# Patient Record
Sex: Female | Born: 1965 | Race: White | Hispanic: No | Marital: Married | Smoking: Current every day smoker
Health system: Southern US, Community
[De-identification: ages and names within clinical notes are randomized; demographics above are authoritative.]

## PROBLEM LIST (undated history)

## (undated) DIAGNOSIS — E119 Type 2 diabetes mellitus without complications: Secondary | ICD-10-CM

## (undated) DIAGNOSIS — F32A Depression, unspecified: Secondary | ICD-10-CM

## (undated) DIAGNOSIS — F329 Major depressive disorder, single episode, unspecified: Secondary | ICD-10-CM

## (undated) HISTORY — PX: KNEE SURGERY: SHX244

## (undated) HISTORY — PX: COLONOSCOPY WITH PROPOFOL: SHX5780

## (undated) HISTORY — PX: ABDOMINAL HYSTERECTOMY: SHX81

---

## 2006-06-28 ENCOUNTER — Ambulatory Visit: Payer: Self-pay | Admitting: Internal Medicine

## 2006-06-28 ENCOUNTER — Emergency Department: Payer: Self-pay | Admitting: Emergency Medicine

## 2006-11-27 ENCOUNTER — Ambulatory Visit: Payer: Self-pay | Admitting: Physician Assistant

## 2010-09-26 ENCOUNTER — Ambulatory Visit: Payer: Self-pay | Admitting: Family Medicine

## 2011-04-05 ENCOUNTER — Ambulatory Visit: Payer: Self-pay | Admitting: Neurology

## 2012-08-30 ENCOUNTER — Ambulatory Visit: Payer: Self-pay | Admitting: Family Medicine

## 2013-06-21 ENCOUNTER — Ambulatory Visit: Payer: Self-pay | Admitting: Family Medicine

## 2014-04-28 ENCOUNTER — Ambulatory Visit
Admit: 2014-04-28 | Disposition: A | Discharge status: Home / Self Care | Payer: Self-pay | Attending: Family Medicine | Admitting: Family Medicine

## 2014-04-28 LAB — URINALYSIS, COMPLETE
Bacteria: NEGATIVE
Bilirubin,UR: NEGATIVE
Blood: NEGATIVE
Glucose,UR: NEGATIVE
Ketone: NEGATIVE
Leukocyte Esterase: NEGATIVE
Nitrite: NEGATIVE
PROTEIN: NEGATIVE
Ph: 5.5 (ref 5.0–8.0)
RBC, UR: NONE SEEN /HPF (ref 0–5)
Specific Gravity: 1.015 (ref 1.000–1.030)

## 2014-04-28 LAB — COMPREHENSIVE METABOLIC PANEL
ALBUMIN: 3.9 g/dL
ALK PHOS: 103 U/L
Anion Gap: 10 (ref 7–16)
BUN: 14 mg/dL
Bilirubin,Total: 0.7 mg/dL
CHLORIDE: 103 mmol/L
CO2: 22 mmol/L
CREATININE: 0.76 mg/dL
Calcium, Total: 9.2 mg/dL
EGFR (African American): >60
EGFR (Non-African Amer.): >60
Glucose: 196 mg/dL — ABNORMAL HIGH
POTASSIUM: 3.5 mmol/L
SGOT(AST): 67 U/L — ABNORMAL HIGH
SGPT (ALT): 75 U/L — ABNORMAL HIGH
SODIUM: 135 mmol/L
Total Protein: 6.8 g/dL

## 2014-04-28 LAB — CBC WITH DIFFERENTIAL/PLATELET
BASOS PCT: 0.1 %
Basophil #: 0 10*3/uL (ref 0.0–0.1)
EOS ABS: 0.1 10*3/uL (ref 0.0–0.7)
Eosinophil %: 0.5 %
HCT: 44 % (ref 35.0–47.0)
HGB: 14.9 g/dL (ref 12.0–16.0)
Lymphocyte #: 0.7 10*3/uL — ABNORMAL LOW (ref 1.0–3.6)
Lymphocyte %: 5.9 %
MCH: 31.8 pg (ref 26.0–34.0)
MCHC: 33.9 g/dL (ref 32.0–36.0)
MCV: 94 fL (ref 80–100)
Monocyte #: 0.1 x10 3/mm — ABNORMAL LOW (ref 0.2–0.9)
Monocyte %: 1 %
NEUTROS PCT: 92.5 %
Neutrophil #: 10.5 10*3/uL — ABNORMAL HIGH (ref 1.4–6.5)
PLATELETS: 163 10*3/uL (ref 150–440)
RBC: 4.69 10*6/uL (ref 3.80–5.20)
RDW: 13.3 % (ref 11.5–14.5)
WBC: 11.4 10*3/uL — ABNORMAL HIGH (ref 3.6–11.0)

## 2014-04-30 ENCOUNTER — Ambulatory Visit
Admit: 2014-04-30 | Disposition: A | Discharge status: Home / Self Care | Payer: Self-pay | Attending: Family Medicine | Admitting: Family Medicine

## 2014-05-02 ENCOUNTER — Ambulatory Visit
Admit: 2014-05-02 | Disposition: A | Discharge status: Home / Self Care | Payer: Self-pay | Attending: Family Medicine | Admitting: Family Medicine

## 2014-05-02 LAB — WOUND CULTURE

## 2014-05-10 ENCOUNTER — Ambulatory Visit
Admit: 2014-05-10 | Disposition: A | Discharge status: Home / Self Care | Payer: Self-pay | Attending: Family Medicine | Admitting: Family Medicine

## 2014-09-13 ENCOUNTER — Ambulatory Visit: Payer: 59

## 2014-09-13 ENCOUNTER — Ambulatory Visit
Admission: EM | Admit: 2014-09-13 | Discharge: 2014-09-13 | Disposition: A | Discharge status: Home / Self Care | Payer: 59 | Attending: Emergency Medicine | Admitting: Emergency Medicine

## 2014-09-13 ENCOUNTER — Other Ambulatory Visit: Payer: Self-pay

## 2014-09-13 DIAGNOSIS — R0781 Pleurodynia: Secondary | ICD-10-CM | POA: Insufficient documentation

## 2014-09-13 DIAGNOSIS — F329 Major depressive disorder, single episode, unspecified: Secondary | ICD-10-CM | POA: Diagnosis not present

## 2014-09-13 DIAGNOSIS — F17210 Nicotine dependence, cigarettes, uncomplicated: Secondary | ICD-10-CM | POA: Insufficient documentation

## 2014-09-13 DIAGNOSIS — E119 Type 2 diabetes mellitus without complications: Secondary | ICD-10-CM | POA: Insufficient documentation

## 2014-09-13 DIAGNOSIS — R0789 Other chest pain: Secondary | ICD-10-CM

## 2014-09-13 DIAGNOSIS — R079 Chest pain, unspecified: Secondary | ICD-10-CM | POA: Diagnosis present

## 2014-09-13 HISTORY — DX: Type 2 diabetes mellitus without complications: E11.9

## 2014-09-13 HISTORY — DX: Depression, unspecified: F32.A

## 2014-09-13 HISTORY — DX: Major depressive disorder, single episode, unspecified: F32.9

## 2014-09-13 MED ORDER — HYDROCODONE-ACETAMINOPHEN 5-325 MG PO TABS: 2.0000 | ORAL_TABLET | ORAL | Status: DC | PRN

## 2014-09-13 MED ORDER — KETOROLAC TROMETHAMINE 60 MG/2ML IM SOLN
60.0000 mg | Freq: Once | INTRAMUSCULAR | Status: AC
  Administered 2014-09-13: 60 mg via INTRAMUSCULAR

## 2014-09-13 MED ORDER — TIZANIDINE HCL 4 MG PO TABS: 4.0000 mg | ORAL_TABLET | Freq: Three times a day (TID) | ORAL | Status: DC | PRN

## 2014-09-13 MED ORDER — DICLOFENAC SODIUM 75 MG PO TBEC: 75.0000 mg | DELAYED_RELEASE_TABLET | Freq: Two times a day (BID) | ORAL | Status: DC

## 2014-09-13 MED ORDER — PREDNISONE 20 MG PO TABS: ORAL_TABLET | ORAL | Status: DC

## 2014-09-13 NOTE — ED Notes (Signed)
Pt states "This rib pain started last Monday, it is right under my left breast. The pain is worse with deep breathing and moving." Pt does state she was lifting heavy dog food on Sunday before this started.

## 2014-09-13 NOTE — Discharge Instructions (Signed)
Take the medication as written. Take 1 gram of tylenol up to 4 times a day as needed for pain and fever. This with the diclofenac is an effective combination for pain. Take the norco only for severe pain. Do not take the tylenol and norco as they both have tylenol in them and too much can hurt your liver. Do not exceed 4 grams of tylenol a day from all sources.  Go to the ER if you get worse or for the signs and symptoms we discussed.  Go to www.goodrx.com to look up your medications. This will give you a list of where you can find your prescriptions at the most affordable prices.

## 2014-09-13 NOTE — ED Provider Notes (Signed)
HPI  SUBJECTIVE:  Robyn Sanchez is a 49 y.o. female who presents with sharp, stabbing constant, daily chest pain located underneath her left breast with occasional radiation to her upper chest. States it has been present for 12 days, starting after lifting heavy bags of dog food the day before. No other trauma to her chest. She reports cough productive of mucus. Symptoms are worse with taking deep breath in, lifting, torso rotation, no alleviating factors. She tried 800 mg ibuprofen last dose at 0900 today, Tylenol, heat, sports cream, icy hot rub. She denies nausea, vomiting, diaphoresis, wheeze, shortness breath, rash in the area. No syncope, abdominal pain. No leg pain or swelling, exogenous estrogen, recent trauma or surgery, hemoptysis, prolonged immobilization. No history of DVT, PE, coronary disease, cancer. Past medical history of diabetes, tobacco use, anxiety, joint pain. Family history significant for mother with MI at age 41, died with MI at age 32.    Past Medical History  Diagnosis Date  . Depression   . Diabetes mellitus without complication     Past Surgical History  Procedure Laterality Date  . Abdominal hysterectomy      No family history on file.  Social History  Substance Use Topics  . Smoking status: Current Every Day Smoker -- 1.00 packs/day    Types: Cigarettes  . Smokeless tobacco: None  . Alcohol Use: No     Current facility-administered medications:  .  ketorolac (TORADOL) injection 60 mg, 60 mg, Intramuscular, Once, Domenick Gong, MD  Current outpatient prescriptions:  .  gabapentin (NEURONTIN) 100 MG capsule, Take 100 mg by mouth 3 (three) times daily., Disp: , Rfl:  .  insulin glargine (LANTUS) 100 UNIT/ML injection, Inject 20 Units into the skin at bedtime., Disp: , Rfl:  .  insulin NPH Human (HUMULIN N,NOVOLIN N) 100 UNIT/ML injection, Inject 8 Units into the skin., Disp: , Rfl:  .  metFORMIN (GLUCOPHAGE) 500 MG tablet, Take by mouth  2 (two) times daily with a meal., Disp: , Rfl:  .  sertraline (ZOLOFT) 50 MG tablet, Take 50 mg by mouth daily., Disp: , Rfl:   Allergies  Allergen Reactions  . Codeine Nausea And Vomiting     ROS  As noted in HPI.   Physical Exam  BP 127/72 mmHg  Pulse 95  Temp(Src) 97.5 F (36.4 C) (Tympanic)  Resp 18  Ht 5\' 6"  (1.676 m)  Wt 209 lb (94.802 kg)  BMI 33.75 kg/m2  SpO2 100%  Constitutional: Well developed, well nourished, no acute distress Eyes: PERRL, EOMI, conjunctiva normal bilaterally HENT: Normocephalic, atraumatic,mucus membranes moist Respiratory: Clear to auscultation bilaterally, no rales, no wheezing, no rhonchi Cardiovascular: Chest wall tenderness along the sixth, seventh rib extending to the mid axillary line, no rash. No bruising. Tenderness at the third and fourth costochondral junction. No crepitus at either location. Normal rate and rhythm, no murmurs, no gallops, no rubs GI: Soft, nondistended, normal bowel sounds, nontender, no rebound, no guarding Back: no CVAT skin: No rash, skin intact Musculoskeletal: No edema, no deformities. Calves symmetric, nontender Neurologic: Alert & oriented x 3, CN II-XII grossly intact, no motor deficits, sensation grossly intact Psychiatric: Speech and behavior appropriate   ED Course   Medications  ketorolac (TORADOL) injection 60 mg (not administered)    Orders Placed This Encounter  Procedures  . DG Chest 2 View    Standing Status: Standing     Number of Occurrences: 1     Standing Expiration Date:  Order Specific Question:  Reason for Exam (SYMPTOM  OR DIAGNOSIS REQUIRED)    Answer:  L sided rib pain  . ED EKG    L rib pain    Standing Status: Standing     Number of Occurrences: 1     Standing Expiration Date:     Order Specific Question:  Reason for Exam    Answer:  Other (See Comments)   No results found for this or any previous visit (from the past 24 hour(s)). No results found.   Dg Chest 2  View  09/13/2014   CLINICAL DATA:  LEFT anterior chest and rib pain after lifting 2 weeks ago, history diabetes mellitus, smoking  EXAM: CHEST  2 VIEW  COMPARISON:  None  FINDINGS: Normal heart size, mediastinal contours and pulmonary vascularity.  Lungs clear.  No pleural effusion or pneumothorax.  Osseous structures unremarkable.  IMPRESSION: No acute abnormalities.   Electronically Signed   By: Ulyses Southward M.D.   On: 09/13/2014 14:59  ED Clinical Impression  1. Rib pain, left side 2. Left sided chest wall pain  ED Assessment/Plan  Patient PERC negative. Doubt PE. No evidence of DVT. No shingles, trauma. Checking EKG given family history and as patient does have several cardiac risk factors. This was obtained while patient was symptomatic. Chest x-ray to rule out pneumothorax. Giving Toradol, will reevaluate.  EKG, normal sinus rhythm, rate 89 normal axis, normal intervals. No hypertrophy. No ST-T wave changes. No previous EKG for comparison.  Reviewed imaging independently. CXR normal. See radiology report for full details.  Normal chest x-ray, normal EKG. Presentation most consistent with musculoskeletal chest wall pain/rib strain. Home with NSAIDs, muscle relaxants, tramadol, short course of prednisone. Advised patient to adjust her insulin accordingly that the steroids will increase her glucose.  Discussed  imaging, MDM, plan and followup with patient . Discussed sn/sx that should prompt return to the UC or ED. Patient  agrees with plan.  *This clinic note was created using Dragon dictation software. Therefore, there may be occasional mistakes despite careful proofreading.  ?  Domenick Gong, MD 09/13/14 1535

## 2015-01-08 IMAGING — CT CT ABD-PELV W/ CM
2 of 5 series · 16 of 46 positions shown, 18 images · IV contrast (isovue)
Comparison: No priors.

CLINICAL DATA: Severe abdominal and pelvic pain for the past
several months. Diarrhea.

EXAM:
CT ABDOMEN AND PELVIS WITH CONTRAST
TECHNIQUE: Multidetector CT imaging of the abdomen and pelvis was performed
using the standard protocol following bolus administration of
intravenous contrast.
CONTRAST:  125 mL of Isovue 370.

[Series 2: routine with · axial · 0.88mm/px · z∈[-1067,-627]mm · 13 of 100 slices shown, 15 images]
[im 6/100  soft-tissue]
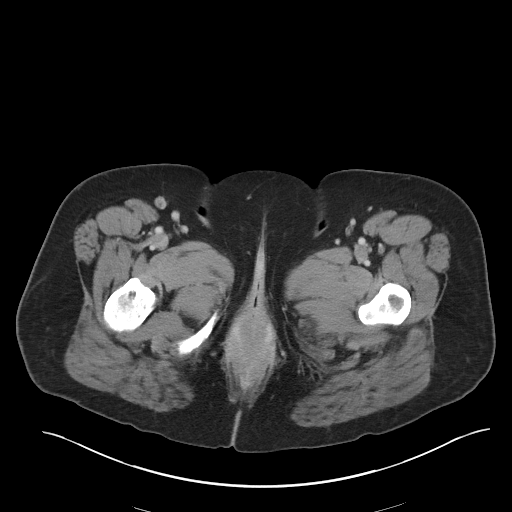
[im 6/100  bone]
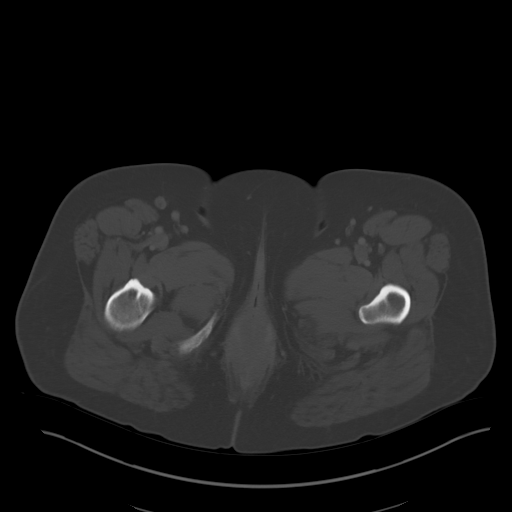
[im 12/100  soft-tissue]
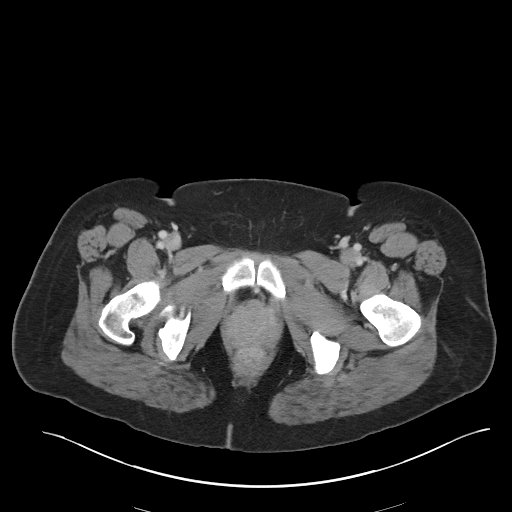
[im 23/100  soft-tissue]
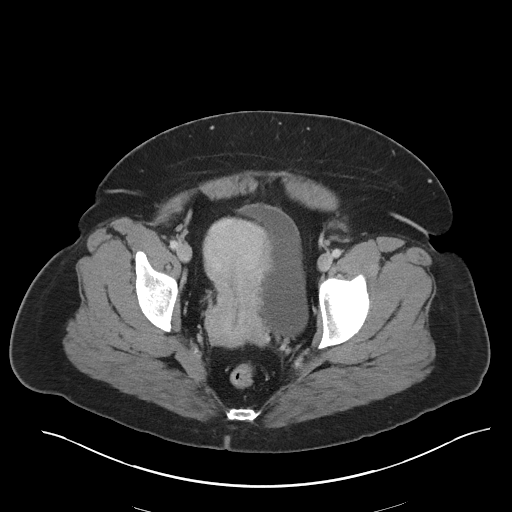
[im 28/100  soft-tissue]
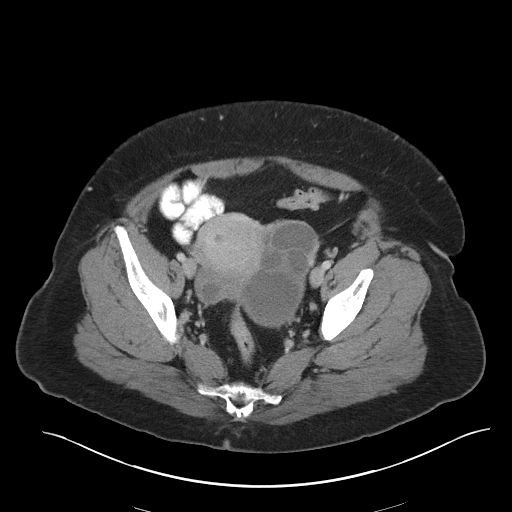
[im 34/100  soft-tissue]
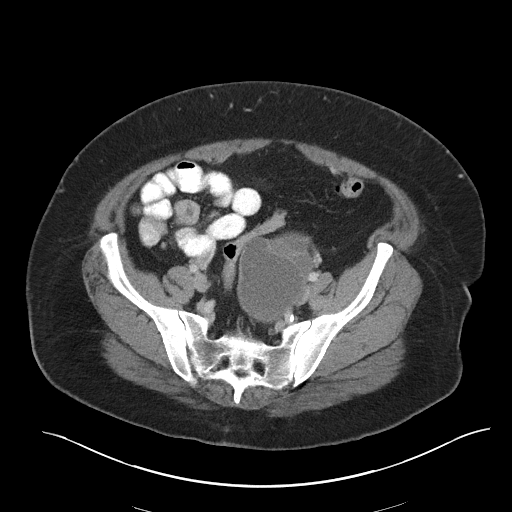
[im 45/100  soft-tissue]
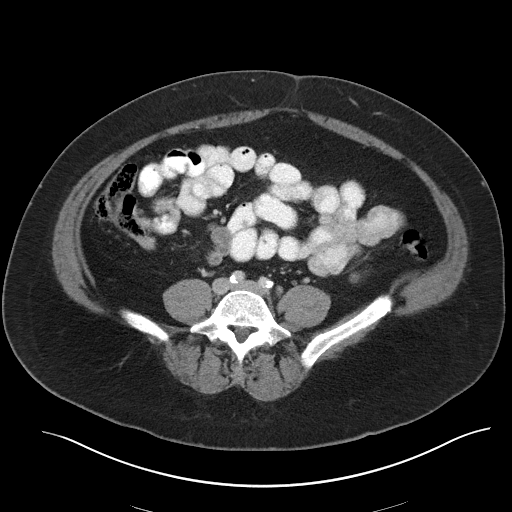
[im 50/100  soft-tissue]
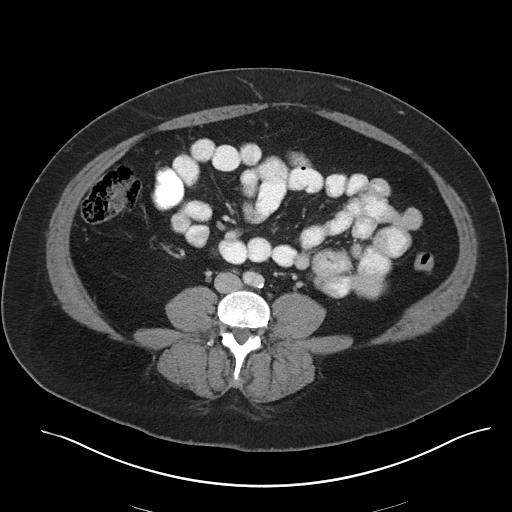
[im 56/100  soft-tissue]
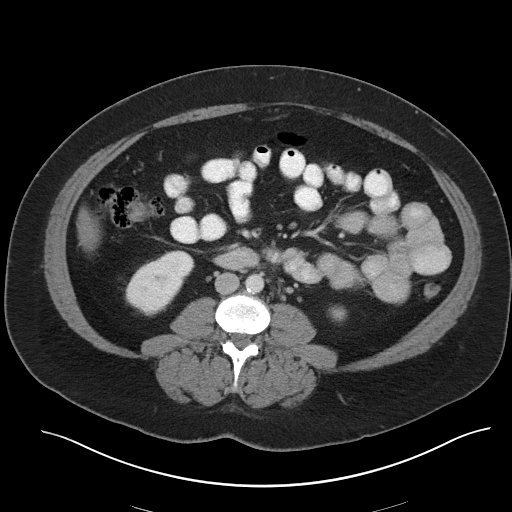
[im 67/100  soft-tissue]
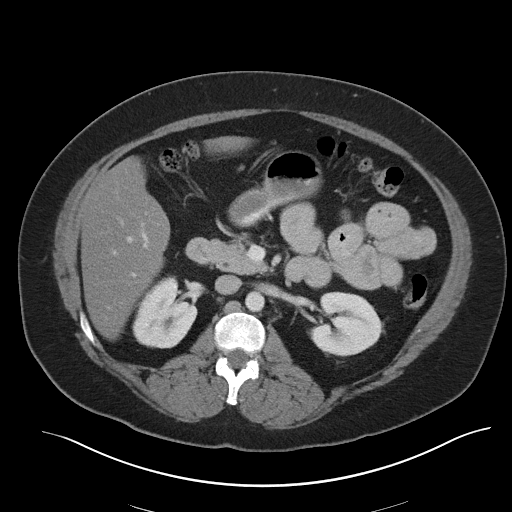
[im 67/100  bone]
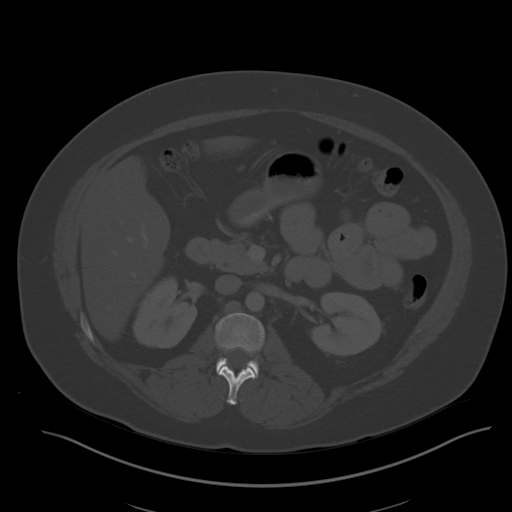
[im 72/100  soft-tissue]
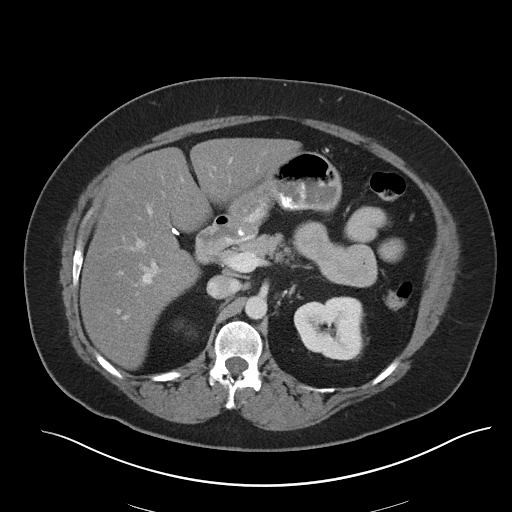
[im 78/100  soft-tissue]
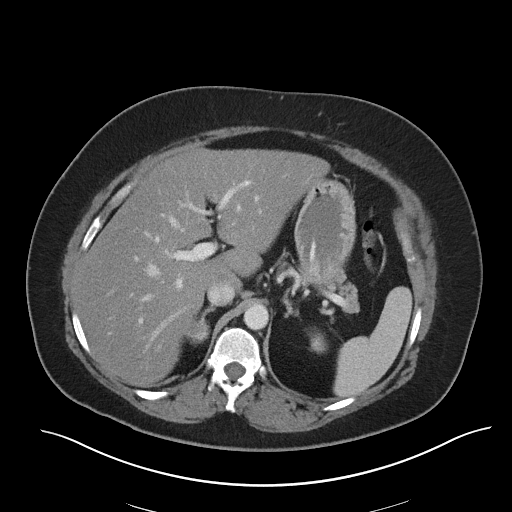
[im 89/100  soft-tissue]
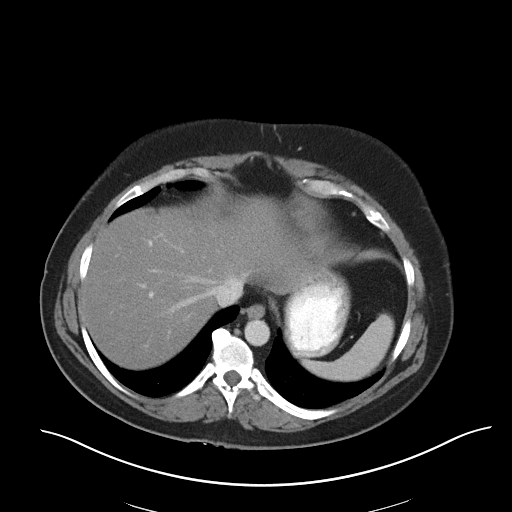
[im 94/100  soft-tissue]
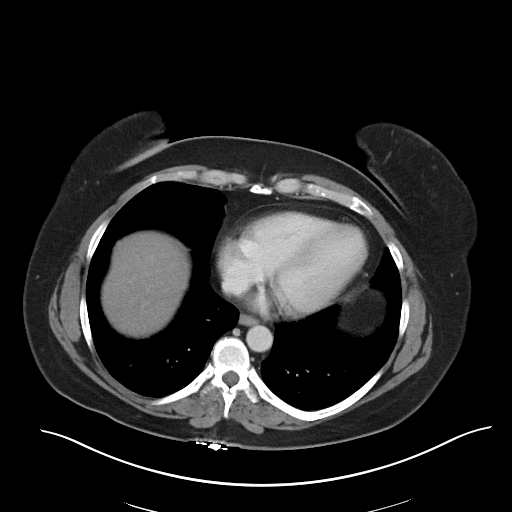

[Series 6: cor routine with · coronal · 0.98mm/px · 3 of 177 slices shown]
[im 59/177  soft-tissue]
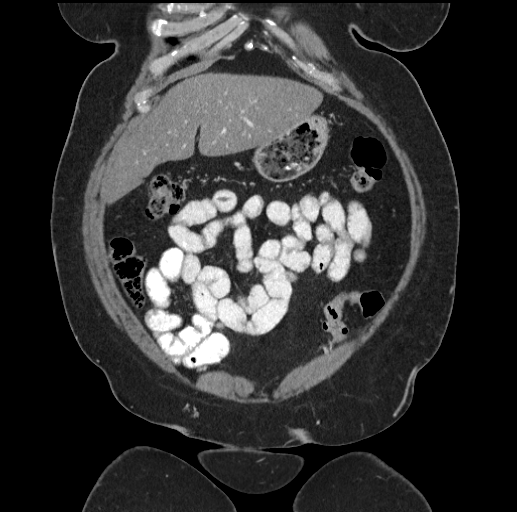
[im 79/177  soft-tissue]
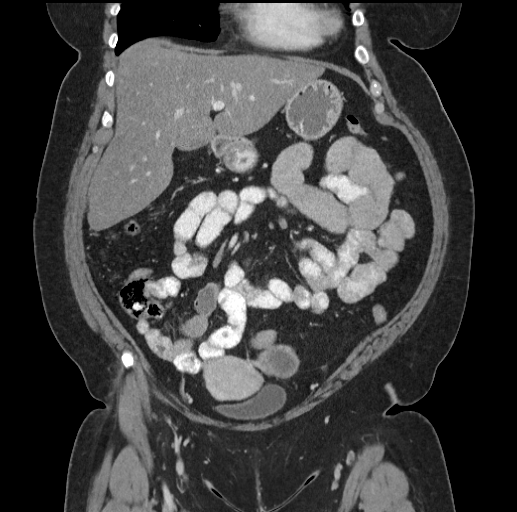
[im 98/177  soft-tissue]
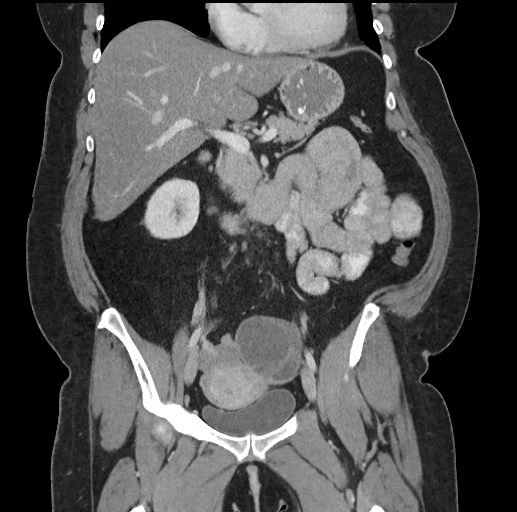

[16 of 46 positions shown; findings below may reference images not displayed]

FINDINGS: Lung Bases: Unremarkable.

Abdomen/Pelvis: 3.2 x 3.1 cm intermediate attenuation (43 Hounsfield
unit) mass in the right adrenal gland, which appears to have
prominent peripheral vessels superiorly.

Diffuse low attenuation throughout the hepatic parenchyma,
indicative of hepatic steatosis. No focal cystic or solid hepatic
lesions. Status post cholecystectomy. The appearance of the
pancreas, spleen, left adrenal gland and bilateral kidneys is
unremarkable. No pneumoperitoneum. No pathologic distention of small
bowel. Although there are numerous prominent retroperitoneal lymph
nodes, these are conspicuous by number rather than size. No definite
pathologically enlarged lymph nodes are identified in the abdomen or
pelvis.

In the left adnexa there is a complex multilocular cystic lesion
measuring 10.0 x 6.2 x 6.6 cm, which has multiple thick internal
septations, concerning for potential ovarian neoplasm. 1.8 cm
low-attenuation lesion in the right adnexa likely represents a
follicle. The uterus is unremarkable in appearance. At this time
there is no significant volume of ascites. Urinary bladder is
unremarkable in appearance.

Musculoskeletal: There are no aggressive appearing lytic or blastic
lesions noted in the visualized portions of the skeleton.
IMPRESSION: 1. Complex multilocular cystic lesion with multiple thick internal
septations measuring 10.0 x 6.2 x 6.6 cm in the left adnexa highly
concerning for potential ovarian neoplasm. Referral to Gynecology in
the immediate future is strongly recommended for surgical
evaluation. If further imaging is desired by Gynecology, a
contrast-enhanced MRI of the pelvis would be recommended.
2. In addition, there is a 3.2 x 3.1 cm mass in the right adrenal
gland. While this could certainly represent an isolated adrenal
metastasis, the possibility of a primary adrenal lesion, including
potential pheochromocytoma, warrants consideration. Further
evaluation with dynamic MRI of the abdomen with and without IV
gadolinium is recommended.
3. Mild hepatic steatosis.
4. Status post cholecystectomy.
These results will be called to the ordering clinician or
representative by the Radiologist Assistant, and communication
documented in the PACS or zVision Dashboard.

## 2015-12-10 ENCOUNTER — Emergency Department
Admission: EM | Admit: 2015-12-10 | Discharge: 2015-12-10 | Disposition: A | Discharge status: Home / Self Care | Payer: BLUE CROSS/BLUE SHIELD | Attending: Emergency Medicine | Admitting: Emergency Medicine

## 2015-12-10 ENCOUNTER — Encounter: Payer: Self-pay | Admitting: Emergency Medicine

## 2015-12-10 ENCOUNTER — Emergency Department: Payer: BLUE CROSS/BLUE SHIELD

## 2015-12-10 DIAGNOSIS — R0789 Other chest pain: Secondary | ICD-10-CM

## 2015-12-10 DIAGNOSIS — Z79899 Other long term (current) drug therapy: Secondary | ICD-10-CM | POA: Insufficient documentation

## 2015-12-10 DIAGNOSIS — E119 Type 2 diabetes mellitus without complications: Secondary | ICD-10-CM | POA: Diagnosis not present

## 2015-12-10 DIAGNOSIS — F17210 Nicotine dependence, cigarettes, uncomplicated: Secondary | ICD-10-CM | POA: Diagnosis not present

## 2015-12-10 DIAGNOSIS — Z794 Long term (current) use of insulin: Secondary | ICD-10-CM | POA: Diagnosis not present

## 2015-12-10 DIAGNOSIS — R079 Chest pain, unspecified: Secondary | ICD-10-CM | POA: Diagnosis present

## 2015-12-10 LAB — BASIC METABOLIC PANEL
Anion gap: 11 (ref 5–15)
BUN: 12 mg/dL (ref 6–20)
CO2: 22 mmol/L (ref 22–32)
Calcium: 9.5 mg/dL (ref 8.9–10.3)
Chloride: 102 mmol/L (ref 101–111)
Creatinine, Ser: 0.83 mg/dL (ref 0.44–1.00)
Glucose, Bld: 245 mg/dL — ABNORMAL HIGH (ref 65–99)
POTASSIUM: 4.4 mmol/L (ref 3.5–5.1)
Sodium: 135 mmol/L (ref 135–145)

## 2015-12-10 LAB — TROPONIN I
Troponin I: <0.03 ng/mL (ref <0.03)
Troponin I: <0.03 ng/mL (ref <0.03)

## 2015-12-10 LAB — CBC
HCT: 45.6 % (ref 35.0–47.0)
Hemoglobin: 15.4 g/dL (ref 12.0–16.0)
MCH: 33.1 pg (ref 26.0–34.0)
MCHC: 33.8 g/dL (ref 32.0–36.0)
MCV: 97.9 fL (ref 80.0–100.0)
PLATELETS: 238 10*3/uL (ref 150–440)
RBC: 4.66 MIL/uL (ref 3.80–5.20)
RDW: 13.4 % (ref 11.5–14.5)
WBC: 12 10*3/uL — ABNORMAL HIGH (ref 3.6–11.0)

## 2015-12-10 MED ORDER — KETOROLAC TROMETHAMINE 30 MG/ML IJ SOLN
30.0000 mg | Freq: Once | INTRAMUSCULAR | Status: AC
  Administered 2015-12-10: 30 mg via INTRAMUSCULAR
  Filled 2015-12-10: qty 1

## 2015-12-10 MED ORDER — TRAMADOL HCL 50 MG PO TABS: 50.0000 mg | ORAL_TABLET | Freq: Four times a day (QID) | ORAL | 0 refills | Status: DC | PRN

## 2015-12-10 MED ORDER — IBUPROFEN 400 MG PO TABS: 400.0000 mg | ORAL_TABLET | Freq: Four times a day (QID) | ORAL | 0 refills | Status: DC | PRN

## 2015-12-10 NOTE — ED Triage Notes (Signed)
Pt presents with left sided chest pain started this am.

## 2015-12-10 NOTE — ED Notes (Signed)

## 2015-12-10 NOTE — ED Provider Notes (Signed)
Time Seen: Approximately *1251  I have reviewed the triage notes  Chief Complaint: Chest Pain   History of Present Illness: Robyn Sanchez is a 50 y.o. female who presents with some left-sided chest and shoulder pain that started approximately 3 hours prior to arrival. She denies any shortness of breath. Occasionally hurts to take a deep breath and she is alsopain with movement. She denies any fever or chills but has had occasional dry nonproductive cough. She denies any history of heart disease or pulmonary emboli risk factors.   Past Medical History:  Diagnosis Date  . Depression   . Diabetes mellitus without complication (HCC)     There are no active problems to display for this patient.   Past Surgical History:  Procedure Laterality Date  . ABDOMINAL HYSTERECTOMY      Past Surgical History:  Procedure Laterality Date  . ABDOMINAL HYSTERECTOMY      Current Outpatient Rx  . Order #: [id number] Class: Print  . Order #: [id number] Class: Historical Med  . Order #: [id number] Class: Print  . Order #: [id number] Class: Print  . Order #: [id number] Class: Historical Med  . Order #: [id number] Class: Historical Med  . Order #: [id number] Class: Historical Med  . Order #: [id number] Class: Print  . Order #: [id number] Class: Historical Med  . Order #: [id number] Class: Print  . Order #: [id number] Class: Print    Allergies:  Codeine  Family History: No family history on file.  Social History: Social History  Substance Use Topics  . Smoking status: Current Every Day Smoker    Packs/day: 1.00    Types: Cigarettes  . Smokeless tobacco: Never Used  . Alcohol use No     Review of Systems:   10 point review of systems was performed and was otherwise negative:  Constitutional: No fever Eyes: No visual disturbances ENT: No sore throat, ear pain CardiacLeft-sided chest pain up her area radiating toward the left shoulder medially movement to the left scaphoid region   Respiratory: No shortness of breath, wheezing, or stridor Abdomen: No abdominal pain, no vomiting, No diarrhea Endocrine: No weight loss, No night sweats Extremities: No peripheral edema, cyanosis Skin: No rashes, easy bruising Neurologic: No focal weakness, trouble with speech or swollowing Urologic: No dysuria, Hematuria, or urinary frequency   Physical Exam:  ED Triage Vitals  Enc Vitals Group     BP 12/10/15 1129 110/70     Pulse Rate 12/10/15 1129 (!) 101     Resp 12/10/15 1129 18     Temp 12/10/15 1129 98.2 F (36.8 C)     Temp Source 12/10/15 1129 Oral     SpO2 12/10/15 1129 98 %     Weight 12/10/15 1120 211 lb (95.7 kg)     Height 12/10/15 1120 5\' 6"  (1.676 m)     Head Circumference --      Peak Flow --      Pain Score 12/10/15 1120 10     Pain Loc --      Pain Edu? --      Excl. in GC? --     General: Awake , Alert , and Oriented times 3; GCS 15 Head: Normal cephalic , atraumatic Eyes: Pupils equal , round, reactive to light Nose/Throat: No nasal drainage, patent upper airway without erythema or exudate.  Neck: Supple, Full range of motion, No anterior adenopathy or palpable thyroid masses Lungs: Clear to ascultation without wheezes , rhonchi, or rales Heart: Regular rate, regular rhythm without murmurs , gallops , or  rubs Abdomen: Soft, non tender without rebound, guarding , or rigidity; bowel sounds positive and symmetric in all 4 quadrants. No organomegaly .        Extremities: 2 plus symmetric pulses. No edema, clubbing or cyanosis Neurologic: normal ambulation, Motor symmetric without deficits, sensory intact Skin: warm, dry, no rashes Pain is reproducible with palpation across the left upper chest wall and also with lateral movement of the left shoulder with some pain over the acromioclavicular region and into the left rhomboid region toward the scapula. There is no crepitus or step-off noted.  Labs:   All laboratory work was reviewed including any  pertinent negatives or positives listed below:  Labs Reviewed  BASIC METABOLIC PANEL - Abnormal; Notable for the following:       Result Value   Glucose, Bld 245 (*)    All other components within normal limits  CBC - Abnormal; Notable for the following:    WBC 12.0 (*)    All other components within normal limits  TROPONIN I  TROPONIN I  Laboratory work was normal  EKG: * ED ECG REPORT I, Jennye MoccasinBrian S Taralyn Ferraiolo, the attending physician, personally viewed and interpreted this ECG.  Date: 12/10/2015 EKG Time: 1124 Rate: 104 Rhythm: Sinus tachycardia QRS Axis: normal Intervals: normal ST/T Wave abnormalities: normal Conduction Disturbances: none Narrative Interpretation: unremarkable No acute ischemic changes   Radiology:  "Dg Chest 2 View  Result Date: 12/10/2015 CLINICAL DATA:  Chest pain. EXAM: CHEST  2 VIEW COMPARISON:  Radiographs of September 13, 2014. FINDINGS: The heart size and mediastinal contours are within normal limits. Both lungs are clear. No pneumothorax or pleural effusion is noted. The visualized skeletal structures are unremarkable. IMPRESSION: No active cardiopulmonary disease. Electronically Signed   By: Lupita RaiderJames  Green Jr, M.D.   On: 12/10/2015 12:15  "  I personally reviewed the radiologic studies     ED Course: * Patient's stay here was uneventful patient otherwise is hemodynamically stable. Her differential includes all causes for chest pain including acute coronary syndrome, aortic dissection, pulmonary embolism, etc. His current presentation and objective findings I felt this is most likely left-sided chest wall pain with possible impingement syndrome of the left shoulder.   Clinical Course      Assessment:  Chest wall pain   Final Clinical Impression:  Final diagnoses:  Chest wall pain     Plan:  Outpatient " Discharge Medication List as of 12/10/2015  2:23 PM    START taking these medications   Details  ibuprofen (ADVIL,MOTRIN) 400 MG  tablet Take 1 tablet (400 mg total) by mouth every 6 (six) hours as needed., Starting Tue 12/10/2015, Print    traMADol (ULTRAM) 50 MG tablet Take 1 tablet (50 mg total) by mouth every 6 (six) hours as needed., Starting Tue 12/10/2015, Print      " Patient was advised to return immediately if condition worsens. Patient was advised to follow up with their primary care physician or other specialized physicians involved in their outpatient care. The patient and/or family member/power of attorney had laboratory results reviewed at the bedside. All questions and concerns were addressed and appropriate discharge instructions were distributed by the nursing staff.             Jennye MoccasinBrian S Camya Haydon, MD 12/10/15 [phone]

## 2015-12-10 NOTE — Discharge Instructions (Signed)
Please return immediately if condition worsens. Please contact her primary physician or the physician you were given for referral. If you have any specialist physicians involved in her treatment and plan please also contact them. Thank you for using [facility] emergency [facility].  Please drink plenty of fluids and he can take over-the-counter Tylenol also as needed. For her pain seems to be from the shoulder or chest wall region.

## 2016-04-19 ENCOUNTER — Emergency Department
Admission: EM | Admit: 2016-04-19 | Discharge: 2016-04-19 | Disposition: A | Discharge status: Home / Self Care | Payer: BLUE CROSS/BLUE SHIELD | Attending: Emergency Medicine | Admitting: Emergency Medicine

## 2016-04-19 ENCOUNTER — Emergency Department: Payer: BLUE CROSS/BLUE SHIELD

## 2016-04-19 ENCOUNTER — Encounter: Payer: Self-pay | Admitting: Emergency Medicine

## 2016-04-19 DIAGNOSIS — R0789 Other chest pain: Secondary | ICD-10-CM | POA: Insufficient documentation

## 2016-04-19 DIAGNOSIS — Z79899 Other long term (current) drug therapy: Secondary | ICD-10-CM | POA: Insufficient documentation

## 2016-04-19 DIAGNOSIS — Z794 Long term (current) use of insulin: Secondary | ICD-10-CM | POA: Diagnosis not present

## 2016-04-19 DIAGNOSIS — E119 Type 2 diabetes mellitus without complications: Secondary | ICD-10-CM | POA: Diagnosis not present

## 2016-04-19 DIAGNOSIS — Z791 Long term (current) use of non-steroidal anti-inflammatories (NSAID): Secondary | ICD-10-CM | POA: Diagnosis not present

## 2016-04-19 DIAGNOSIS — F17210 Nicotine dependence, cigarettes, uncomplicated: Secondary | ICD-10-CM | POA: Diagnosis not present

## 2016-04-19 DIAGNOSIS — R079 Chest pain, unspecified: Secondary | ICD-10-CM

## 2016-04-19 LAB — CBC
HEMATOCRIT: 45.3 % (ref 35.0–47.0)
HEMOGLOBIN: 15.4 g/dL (ref 12.0–16.0)
MCH: 34 pg (ref 26.0–34.0)
MCHC: 34 g/dL (ref 32.0–36.0)
MCV: 100.1 fL — ABNORMAL HIGH (ref 80.0–100.0)
Platelets: 209 10*3/uL (ref 150–440)
RBC: 4.53 MIL/uL (ref 3.80–5.20)
RDW: 13.2 % (ref 11.5–14.5)
WBC: 14.2 10*3/uL — ABNORMAL HIGH (ref 3.6–11.0)

## 2016-04-19 LAB — BASIC METABOLIC PANEL
ANION GAP: 18 — AB (ref 5–15)
BUN: 10 mg/dL (ref 6–20)
CALCIUM: 10 mg/dL (ref 8.9–10.3)
CO2: 17 mmol/L — AB (ref 22–32)
Chloride: 101 mmol/L (ref 101–111)
Creatinine, Ser: 0.64 mg/dL (ref 0.44–1.00)
GFR calc Af Amer: >60 mL/min (ref >60)
GFR calc non Af Amer: >60 mL/min (ref >60)
Glucose, Bld: 109 mg/dL — ABNORMAL HIGH (ref 65–99)
Potassium: 3.8 mmol/L (ref 3.5–5.1)
Sodium: 136 mmol/L (ref 135–145)

## 2016-04-19 LAB — TROPONIN I
Troponin I: <0.03 ng/mL (ref <0.03)
Troponin I: <0.03 ng/mL (ref <0.03)

## 2016-04-19 LAB — FIBRIN DERIVATIVES D-DIMER (ARMC ONLY): Fibrin derivatives D-dimer (ARMC): 264.01 (ref 0.00–499.00)

## 2016-04-19 MED ORDER — TRAMADOL HCL 50 MG PO TABS: 50.0000 mg | ORAL_TABLET | Freq: Four times a day (QID) | ORAL | 0 refills | Status: AC | PRN

## 2016-04-19 MED ORDER — HYDROCODONE-ACETAMINOPHEN 5-325 MG PO TABS
2.0000 | ORAL_TABLET | Freq: Once | ORAL | Status: AC
  Administered 2016-04-19: 2 via ORAL
  Filled 2016-04-19: qty 2

## 2016-04-19 MED ORDER — TRAMADOL HCL 50 MG PO TABS
100.0000 mg | ORAL_TABLET | Freq: Once | ORAL | Status: AC
  Administered 2016-04-19: 100 mg via ORAL
  Filled 2016-04-19: qty 2

## 2016-04-19 MED ORDER — HYDROCODONE-ACETAMINOPHEN 5-325 MG PO TABS: 1.0000 | ORAL_TABLET | ORAL | 0 refills | Status: AC | PRN

## 2016-04-19 NOTE — ED Provider Notes (Signed)
[facility] Emergency Department Provider Note  Time seen: 7:38 PM  I have reviewed the triage vital signs and the nursing notes.   HISTORY  Chief Complaint Chest Pain    HPI Robyn Sanchez is a 51 y.o. female with a past medical history of diabetes and depression who presents to the emergency department for chest pain. According to the patient for the past 3 days she has been experiencing left-sided chest pain worse with movement. 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 Denies any shortness of breath. Denies any leg pain or swelling. She does state pain with deep inspiration but more so with any movement of her left upper extremity.  Past Medical History:  Diagnosis Date  . Depression   . Diabetes mellitus without complication (HCC)     There are no active problems to display for this patient.   Past Surgical History:  Procedure Laterality Date  . ABDOMINAL HYSTERECTOMY      Prior to Admission medications   Medication Sig Start Date End Date Taking? Authorizing Provider  diclofenac (VOLTAREN) 75 MG EC tablet Take 1 tablet (75 mg total) by mouth 2 (two) times daily. Take with food 09/13/14   Domenick Gong, MD  gabapentin (NEURONTIN) 100 MG capsule Take 100 mg by mouth 3 (three) times daily.    Historical Provider, MD  HYDROcodone-acetaminophen (NORCO/VICODIN) 5-325 MG per tablet Take 2 tablets by mouth every 4 (four) hours as needed for moderate pain. 09/13/14   Domenick Gong, MD  ibuprofen (ADVIL,MOTRIN) 400 MG tablet Take 1 tablet (400 mg total) by mouth every 6 (six) hours as needed. 12/10/15   Jennye Moccasin, MD  insulin glargine (LANTUS) 100 UNIT/ML injection Inject 20 Units into the skin at bedtime.    Historical Provider, MD  insulin NPH Human (HUMULIN N,NOVOLIN N) 100 UNIT/ML injection Inject 8 Units into the skin.    Historical Provider, MD   metFORMIN (GLUCOPHAGE) 500 MG tablet Take by mouth 2 (two) times daily with a meal.    Historical Provider, MD  predniSONE (DELTASONE) 20 MG tablet Take 3 tabs po on first day, 2 tabs second day, 2 tabs third day, 1 tab fourth day, 1 tab 5th day. Take with food. 09/13/14   Domenick Gong, MD  sertraline (ZOLOFT) 50 MG tablet Take 50 mg by mouth daily.    Historical Provider, MD  tiZANidine (ZANAFLEX) 4 MG tablet Take 1 tablet (4 mg total) by mouth every 8 (eight) hours as needed for muscle spasms. 09/13/14   Domenick Gong, MD  traMADol (ULTRAM) 50 MG tablet Take 1 tablet (50 mg total) by mouth every 6 (six) hours as needed. 12/10/15   Jennye Moccasin, MD    Allergies  Allergen Reactions  . Codeine Nausea And Vomiting    No family history on file.  Social History Social History  Substance Use Topics  . Smoking status: Current Every Day Smoker    Packs/day: 1.00    Types: Cigarettes  . Smokeless tobacco: Never Used  . Alcohol use No    Review of Systems Constitutional: Negative for fever. Cardiovascular: Positive for chest pain worse with movement or palpation. Respiratory: Negative for shortness of breath. Gastrointestinal: Negative for abdominal pain Musculoskeletal: Chest wall pain of shoulder pain Neurological: Negative for headache 10-point ROS otherwise negative.  ____________________________________________   PHYSICAL EXAM:  VITAL SIGNS: ED Triage Vitals  Enc Vitals Group  BP 04/19/16 1609 (!) 143/65     Pulse Rate 04/19/16 1609 (!) 108     Resp --      Temp 04/19/16 1609 98.6 F (37 C)     Temp Source 04/19/16 1609 Oral     SpO2 04/19/16 1609 96 %     Weight 04/19/16 1606 210 lb (95.3 kg)     Height 04/19/16 1606  (1.676 m)     Head Circumference --      Peak Flow --      Pain Score 04/19/16 1605 10     Pain Loc --      Pain Edu? --      Excl. in GC? --     Constitutional: Alert and oriented. Well appearing and in no distress. Eyes: Normal  exam ENT   Head: Normocephalic and atraumatic.   Mouth/Throat: Mucous membranes are moist. Cardiovascular: Normal rate, regular rhythm. No murmur Respiratory: Normal respiratory effort without tachypnea nor retractions. Breath sounds are clear. Patient has moderate tenderness palpation of the left anterior chest wall as well as left shoulder and trapezius muscle. Pain is very reproducible on exam or with movement of the left arm. Good range of motion in the left shoulder and the left arm is neurovascularly intact. Gastrointestinal: Soft and nontender. No distention. Musculoskeletal: Nontender with normal range of motion in all extremities. No lower extremity tenderness or edema. Neurologic:  Normal speech and language. No gross focal neurologic deficits Skin:  Skin is warm, dry and intact.  Psychiatric: Mood and affect are normal.   ____________________________________________    EKG  EKG shows sinus tachycardia 116 bpm, narrow QRS, normal axis, normal intervals, nonspecific ST changes without ST elevation.  ____________________________________________    RADIOLOGY  IMPRESSION: No active cardiopulmonary disease.  ____________________________________________   INITIAL IMPRESSION / ASSESSMENT AND PLAN / ED COURSE  Pertinent labs & imaging results that were available during my care of the patient were reviewed by me and considered in my medical decision making (see chart for details).  Patient presents to the emergency department left-sided chest pain worse with movement or deep inspiration. Chest pain is extremely reproducible on examination most consistent with muscular skeletal pain. However given the patient's persistent tachycardia in the emergency department around 100-110 bpm along with pleuritic nature of her chest pain have added on a d-dimer. Patient would be very low risk. Highly suspect musculoskeletal pain. Patient's initial labs including troponin are negative. EKG  is reassuring. Chest x-ray is negative. We will add on a d-dimer as well as a repeat troponin. If negative the patient will be discharged home. We will dose Ultram for pain relief.  D-dimer and repeat troponin are negative. Highly suspect muscular skeletal pain. Patient will be discharged home with PCP follow-up  ____________________________________________   FINAL CLINICAL IMPRESSION(S) / ED DIAGNOSES  Chest pain Musculoskeletal pain   Minna Antis, MD 04/19/16 2030

## 2016-04-19 NOTE — Discharge Instructions (Signed)
You have been seen in the emergency department today for chest pain. Your workup has shown normal results, and her symptoms are most consistent with musculoskeletal pain. As we discussed please follow-up with your primary care physician in the next 1-2 days for recheck. Return to the emergency department for any further chest pain, trouble breathing, or any other symptom personally concerning to yourself.

## 2016-04-19 NOTE — ED Notes (Signed)
siganture pad not working, pt and husband verbalized understanding and follow up discussed

## 2016-04-19 NOTE — ED Triage Notes (Signed)
Pt reports left sided chest pain x3 days, reports radiation to neck and back. Pt hx of smoking, reports 3 beers today. Pt hyperventilating upon arrival.

## 2017-06-28 IMAGING — CR DG CHEST 2V
2 series · 2 of 2 positions shown · non-contrast
Comparison: Radiographs September 13, 2014.

CLINICAL DATA: Chest pain.

EXAM:
CHEST  2 VIEW

[chest pa]
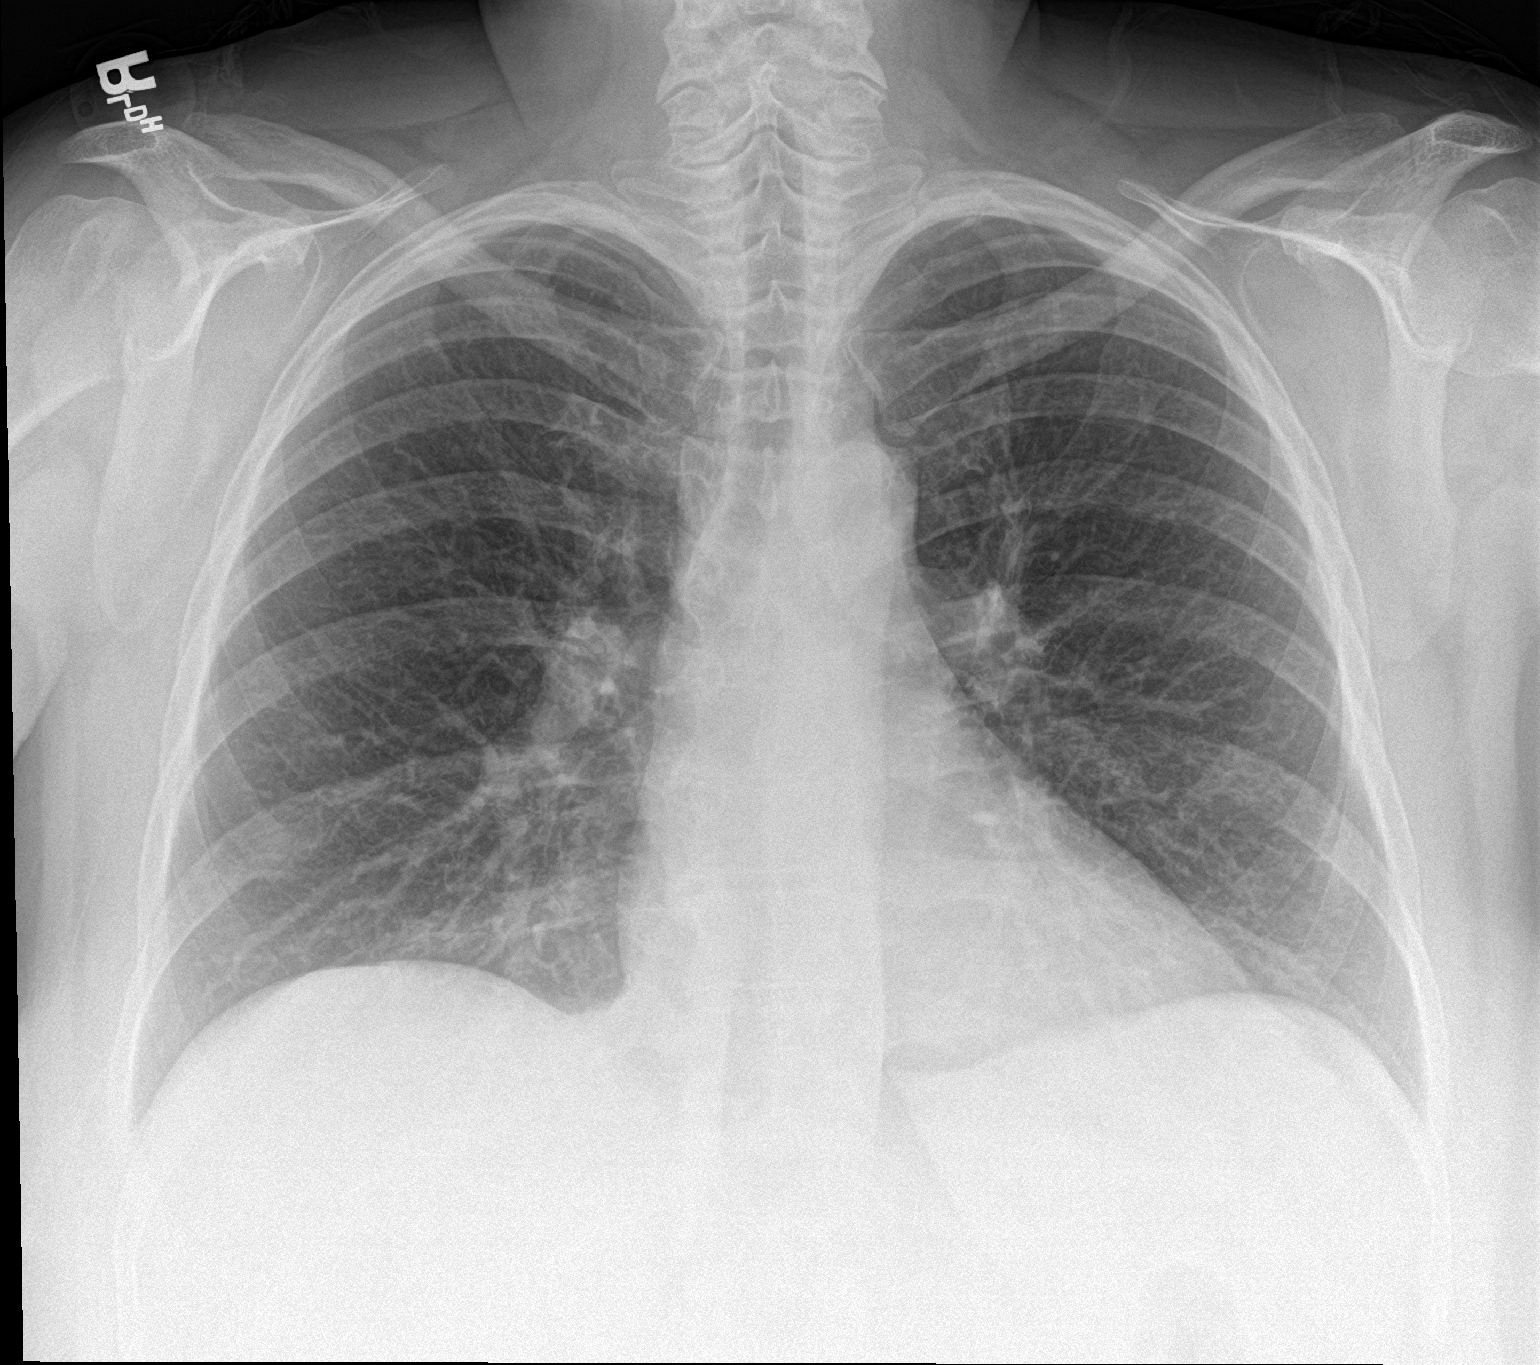

[chest lat]
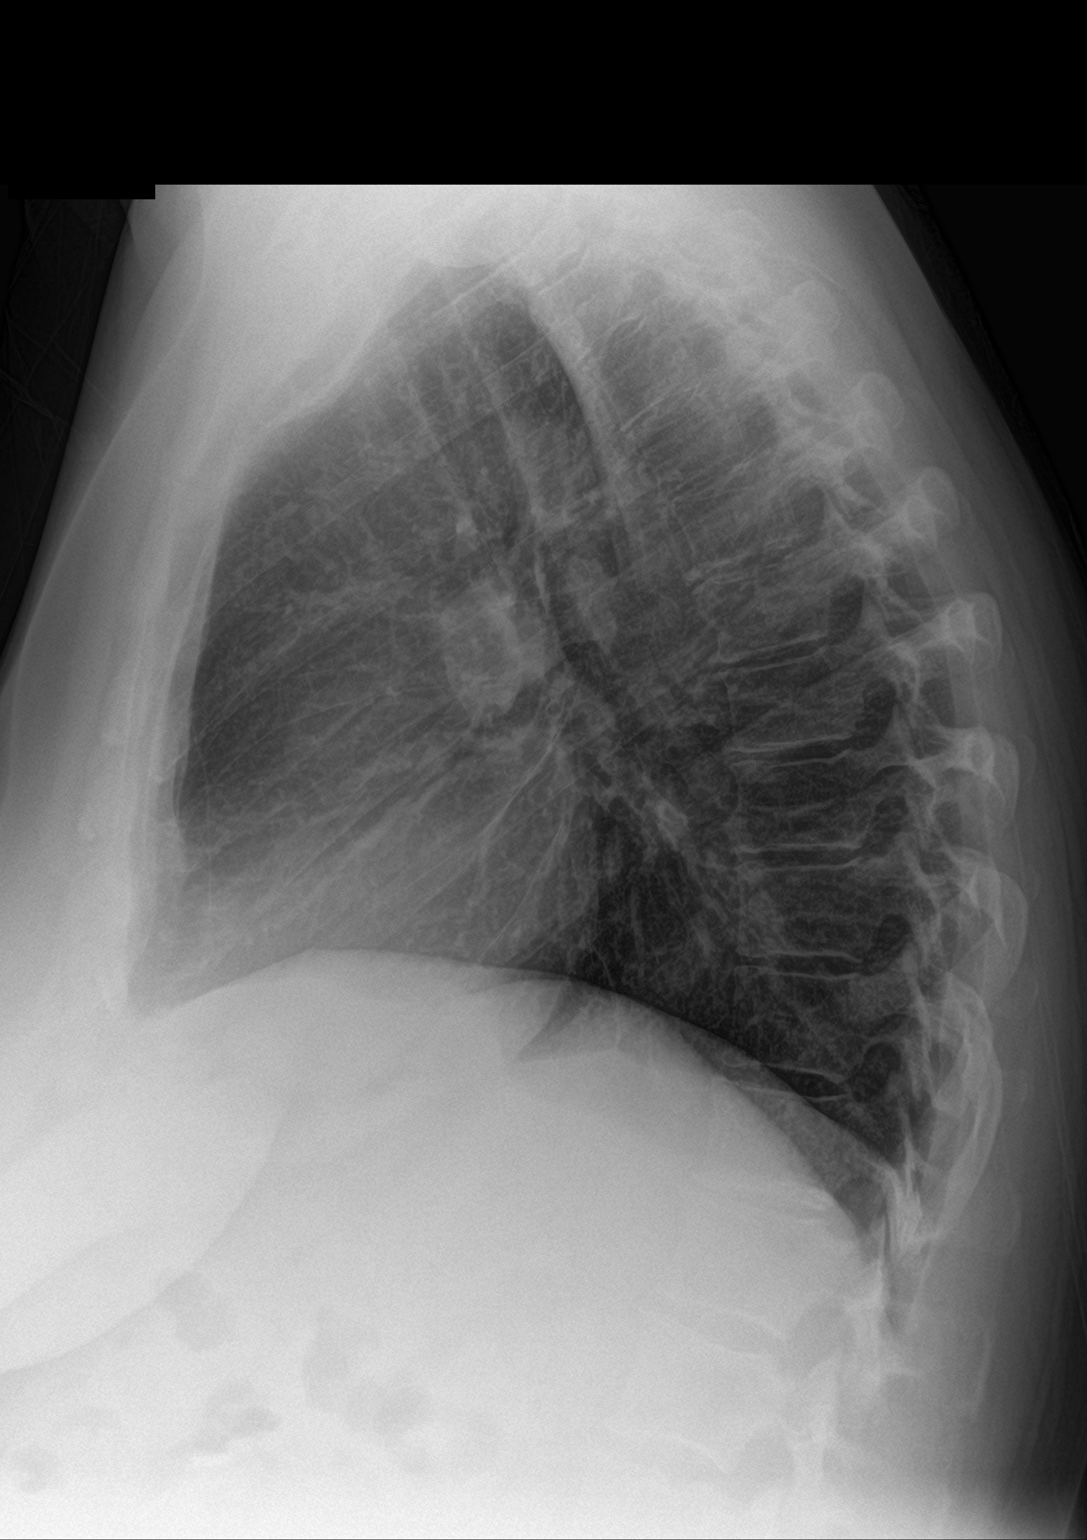

[2 of 2 positions shown; findings below may reference images not displayed]

FINDINGS: The heart size and mediastinal contours are within normal limits.
Both lungs are clear. No pneumothorax or pleural effusion is noted.
The visualized skeletal structures are unremarkable.
IMPRESSION: No active cardiopulmonary disease.

## 2018-07-08 ENCOUNTER — Other Ambulatory Visit: Payer: Self-pay

## 2018-07-08 ENCOUNTER — Encounter: Payer: Self-pay | Admitting: Emergency Medicine

## 2018-07-08 ENCOUNTER — Emergency Department
Admission: EM | Admit: 2018-07-08 | Discharge: 2018-07-09 | Disposition: A | Discharge status: Home / Self Care | Payer: BC Managed Care – PPO | Attending: Emergency Medicine | Admitting: Emergency Medicine

## 2018-07-08 DIAGNOSIS — F17210 Nicotine dependence, cigarettes, uncomplicated: Secondary | ICD-10-CM | POA: Insufficient documentation

## 2018-07-08 DIAGNOSIS — Z79899 Other long term (current) drug therapy: Secondary | ICD-10-CM | POA: Insufficient documentation

## 2018-07-08 DIAGNOSIS — E119 Type 2 diabetes mellitus without complications: Secondary | ICD-10-CM | POA: Diagnosis not present

## 2018-07-08 DIAGNOSIS — F10929 Alcohol use, unspecified with intoxication, unspecified: Secondary | ICD-10-CM | POA: Diagnosis present

## 2018-07-08 DIAGNOSIS — R45851 Suicidal ideations: Secondary | ICD-10-CM

## 2018-07-08 DIAGNOSIS — F329 Major depressive disorder, single episode, unspecified: Secondary | ICD-10-CM | POA: Diagnosis not present

## 2018-07-08 DIAGNOSIS — F419 Anxiety disorder, unspecified: Secondary | ICD-10-CM | POA: Insufficient documentation

## 2018-07-08 LAB — COMPREHENSIVE METABOLIC PANEL
ALT: 44 U/L (ref 0–44)
AST: 38 U/L (ref 15–41)
Albumin: 4.2 g/dL (ref 3.5–5.0)
Alkaline Phosphatase: 83 U/L (ref 38–126)
Anion gap: 15 (ref 5–15)
BUN: 7 mg/dL (ref 6–20)
CO2: 20 mmol/L — ABNORMAL LOW (ref 22–32)
Calcium: 9.1 mg/dL (ref 8.9–10.3)
Chloride: 99 mmol/L (ref 98–111)
Creatinine, Ser: 0.65 mg/dL (ref 0.44–1.00)
GFR calc Af Amer: >60 mL/min (ref >60)
GFR calc non Af Amer: >60 mL/min (ref >60)
Glucose, Bld: 125 mg/dL — ABNORMAL HIGH (ref 70–99)
Potassium: 3.6 mmol/L (ref 3.5–5.1)
Sodium: 134 mmol/L — ABNORMAL LOW (ref 135–145)
Total Bilirubin: 0.4 mg/dL (ref 0.3–1.2)
Total Protein: 7.1 g/dL (ref 6.5–8.1)

## 2018-07-08 LAB — CBC
HCT: 43.9 % (ref 36.0–46.0)
Hemoglobin: 15.1 g/dL — ABNORMAL HIGH (ref 12.0–15.0)
MCH: 33.7 pg (ref 26.0–34.0)
MCHC: 34.4 g/dL (ref 30.0–36.0)
MCV: 98 fL (ref 80.0–100.0)
Platelets: 142 10*3/uL — ABNORMAL LOW (ref 150–400)
RBC: 4.48 MIL/uL (ref 3.87–5.11)
RDW: 12.6 % (ref 11.5–15.5)
WBC: 10.3 10*3/uL (ref 4.0–10.5)
nRBC: 0 % (ref 0.0–0.2)

## 2018-07-08 LAB — URINE DRUG SCREEN, QUALITATIVE (ARMC ONLY)
Amphetamines, Ur Screen: NOT DETECTED
Barbiturates, Ur Screen: NOT DETECTED
Benzodiazepine, Ur Scrn: NOT DETECTED
Cannabinoid 50 Ng, Ur ~~LOC~~: NOT DETECTED
Cocaine Metabolite,Ur ~~LOC~~: NOT DETECTED
MDMA (Ecstasy)Ur Screen: NOT DETECTED
Methadone Scn, Ur: NOT DETECTED
Opiate, Ur Screen: NOT DETECTED
Phencyclidine (PCP) Ur S: NOT DETECTED
Tricyclic, Ur Screen: NOT DETECTED

## 2018-07-08 LAB — ETHANOL: Alcohol, Ethyl (B): 129 mg/dL — ABNORMAL HIGH (ref <10)

## 2018-07-08 LAB — SALICYLATE LEVEL: Salicylate Lvl: 25.3 mg/dL (ref 2.8–30.0)

## 2018-07-08 LAB — ACETAMINOPHEN LEVEL: Acetaminophen (Tylenol), Serum: 57 ug/mL — ABNORMAL HIGH (ref 10–30)

## 2018-07-08 NOTE — ED Notes (Signed)
Pt. States "I was drinking tonight, took melatonin and OTC HA medication.  I must of texted a friend and indicated I was suicidal(I do not remember why I did this).  Pt. States "I am married, have a grandson, I have a good life".  Pt. Denies SI/HI or AV/H.  Pt. Calm and cooperative.

## 2018-07-08 NOTE — ED Triage Notes (Signed)
Pt arrives via [facility] office with IVC paperwork for SI texts. Pt denies all thoughts and feeling of SI at this time. Per paperwork, pt was unresponsive on scene. Pt is alert and oriented, calm, and cooperative and in NAD.

## 2018-07-08 NOTE — ED Notes (Signed)
Pt removed one yellow ring, one yellow ring with white stone, one necklace with two gray rings on it, one red fit bit, one pair black slides, one black t-shirt, one pair pink shorts, and one pair pink boxers and placed into hospital burg andy attire.

## 2018-07-09 LAB — ACETAMINOPHEN LEVEL: Acetaminophen (Tylenol), Serum: 45 ug/mL — ABNORMAL HIGH (ref 10–30)

## 2018-07-09 MED ORDER — SODIUM CHLORIDE 0.9 % IV BOLUS
1000.0000 mL | Freq: Once | INTRAVENOUS | Status: AC
  Administered 2018-07-09: 1000 mL via INTRAVENOUS

## 2018-07-09 NOTE — ED Notes (Signed)
Pt. Going home with husband. 

## 2018-07-09 NOTE — ED Notes (Signed)
Pt. Finished SOC, pt. Returned to [employer] [facility] bed.

## 2018-07-09 NOTE — Discharge Instructions (Addendum)
You have been seen in the Emergency Department (ED)  today for a psychiatric complaint.  You have been evaluated by psychiatry and we believe you are safe to be discharged from the hospital.   ° °Please return to the Emergency Department (ED)  immediately if you have ANY thoughts of hurting yourself or anyone else, so that we may help you. ° °Please avoid alcohol and drug use. ° °Follow up with your doctor and/or therapist as soon as possible regarding today's ED  visit.  ° °You may call crisis hotline for [place] at [phone]. ° °

## 2018-07-09 NOTE — ED Notes (Signed)
SOC called report given, pt. Escorted to interview room with security escort. Pt. Ready.

## 2018-07-09 NOTE — ED Notes (Signed)
SOC called back to say they recommend discharge.

## 2018-07-09 NOTE — ED Provider Notes (Addendum)
[facility] Emergency Department Provider Note  ____________________________________________  Time seen: Approximately 12:05 AM  I have reviewed the triage vital signs and the nursing notes.   HISTORY  Chief Complaint Suicidal   HPI Jearld LeschSally Elizabeth Situ is a 53 y.o. female with a history of anxiety, depression, diabetes who presents under IVC for evaluation of suicidal ideation.  Patient reports that she had several beers today.  Took her melatonin and went to bed.  Woke up few hours later with a headache.  Took some over-the-counter medications for headache.  Patient reports that after then "I must have sent a stupid text to a friend."  Patient is very vague about the content of the texted.  She denies any active suicidal ideation or any past suicidal ideation or attempts.  She denies being depressed.  She reports "I have an amazing grandson, and awesome job, and an awesome husband."  She denies any medical complaints at this time.  Past Medical History:  Diagnosis Date  . Depression   . Diabetes mellitus without complication (HCC)     There are no active problems to display for this patient.   Past Surgical History:  Procedure Laterality Date  . ABDOMINAL HYSTERECTOMY      Prior to Admission medications   Medication Sig Start Date End Date Taking? Authorizing Provider  diclofenac (VOLTAREN) 75 MG EC tablet Take 1 tablet (75 mg total) by mouth 2 (two) times daily. Take with food 09/13/14   Domenick GongMortenson, Ashley, MD  gabapentin (NEURONTIN) 100 MG capsule Take 100 mg by mouth 3 (three) times daily.    [provider]  ibuprofen (ADVIL,MOTRIN) 400 MG tablet Take 1 tablet (400 mg total) by mouth every 6 (six) hours as needed. 12/10/15   Jennye MoccasinQuigley, Brian S, MD  insulin glargine (LANTUS) 100 UNIT/ML injection Inject 20 Units into the skin at bedtime.    [provider]  insulin NPH Human (HUMULIN N,NOVOLIN N) 100 UNIT/ML injection Inject 8  Units into the skin.    [provider]  metFORMIN (GLUCOPHAGE) 500 MG tablet Take by mouth 2 (two) times daily with a meal.    [provider]  predniSONE (DELTASONE) 20 MG tablet Take 3 tabs po on first day, 2 tabs second day, 2 tabs third day, 1 tab fourth day, 1 tab 5th day. Take with food. 09/13/14   Domenick GongMortenson, Ashley, MD  sertraline (ZOLOFT) 50 MG tablet Take 50 mg by mouth daily.    [provider]  tiZANidine (ZANAFLEX) 4 MG tablet Take 1 tablet (4 mg total) by mouth every 8 (eight) hours as needed for muscle spasms. 09/13/14   Domenick GongMortenson, Ashley, MD    Allergies Codeine  No family history on file.  Social History Social History   Tobacco Use  . Smoking status: Current Every Day Smoker    Packs/day: 1.00    Types: Cigarettes  . Smokeless tobacco: Never Used  Substance Use Topics  . Alcohol use: Yes  . Drug use: Never    Review of Systems  Constitutional: Negative for fever. Eyes: Negative for visual changes. ENT: Negative for sore throat. Neck: No neck pain  Cardiovascular: Negative for chest pain. Respiratory: Negative for shortness of breath. Gastrointestinal: Negative for abdominal pain, vomiting or diarrhea. Genitourinary: Negative for dysuria. Musculoskeletal: Negative for back pain. Skin: Negative for rash. Neurological: Negative for headaches, weakness or numbness. Psych: No SI or HI  ____________________________________________   PHYSICAL EXAM:  VITAL SIGNS: ED Triage Vitals  Enc  Vitals Group     BP 07/08/18 2231 (!) 163/84     Pulse Rate 07/08/18 2231 86     Resp 07/08/18 2231 19     Temp 07/08/18 2231 98.7 F (37.1 C)     Temp Source 07/08/18 2231 Oral     SpO2 07/08/18 2231 98 %     Weight 07/08/18 2226 200 lb (90.7 kg)     Height 07/08/18 2226 5\' 6"  (1.676 m)     Head Circumference --      Peak Flow --      Pain Score 07/08/18 2226 0     Pain Loc --      Pain Edu? --      Excl. in GC? --     Constitutional:  Alert and oriented. Well appearing and in no apparent distress. HEENT:      Head: Normocephalic and atraumatic.         Eyes: Conjunctivae are normal. Sclera is non-icteric.       Mouth/Throat: Mucous membranes are moist.       Neck: Supple with no signs of meningismus. Cardiovascular: Regular rate and rhythm. No murmurs, gallops, or rubs. 2+ symmetrical distal pulses are present in all extremities. No JVD. Respiratory: Normal respiratory effort. Lungs are clear to auscultation bilaterally. No wheezes, crackles, or rhonchi.  Gastrointestinal: Soft, non tender, and non distended with positive bowel sounds. No rebound or guarding. Musculoskeletal: Nontender with normal range of motion in all extremities. No edema, cyanosis, or erythema of extremities. Neurologic: Normal speech and language. Face is symmetric. Moving all extremities. No gross focal neurologic deficits are appreciated. Skin: Skin is warm, dry and intact. No rash noted. Psychiatric: Mood and affect are normal. Speech and behavior are normal.  ____________________________________________   LABS (all labs ordered are listed, but only abnormal results are displayed)  Labs Reviewed  COMPREHENSIVE METABOLIC PANEL - Abnormal; Notable for the following components:      Result Value   Sodium 134 (*)    CO2 20 (*)    Glucose, Bld 125 (*)    All other components within normal limits  ETHANOL - Abnormal; Notable for the following components:   Alcohol, Ethyl (B) 129 (*)    All other components within normal limits  ACETAMINOPHEN LEVEL - Abnormal; Notable for the following components:   Acetaminophen (Tylenol), Serum 57 (*)    All other components within normal limits  CBC - Abnormal; Notable for the following components:   Hemoglobin 15.1 (*)    Platelets 142 (*)    All other components within normal limits  ACETAMINOPHEN LEVEL - Abnormal; Notable for the following components:   Acetaminophen (Tylenol), Serum 45 (*)    All  other components within normal limits  SALICYLATE LEVEL  URINE DRUG SCREEN, QUALITATIVE ([facility] ONLY)   ____________________________________________  EKG  none  ____________________________________________  RADIOLOGY  none  ____________________________________________   PROCEDURES  Procedure(s) performed: None Procedures Critical Care performed:  None ____________________________________________   INITIAL IMPRESSION / ASSESSMENT AND PLAN / ED COURSE   53 y.o. female with a history of anxiety, depression, diabetes who presents under IVC for evaluation of suicidal ideation.  Patient is currently denying any suicidal ideation.  Reports drinking several beers tonight.  She denies taking any more than prescribed of her melatonin before bedtime or any other intentional ingestions.  Labs for medical clearance showing alcohol level of 129, acetaminophen level of 57. Patient reports taking 3 x 500mg  tylenol for her HA. Denies  intentional overdose. Unknown time that she took the meds. Will hydrate and repeat acetaminophen level, will hold NAC at this time. Liver enzymes WNL.  Will consult psych for evaluation.   _________________________ 2:31 AM on 07/09/2018 -----------------------------------------  Repeat tylenol level trending down. Patient medically cleared pending psych eval.      _________________________ 6:30 AM on 07/09/2018 -----------------------------------------  Patient evaluated by Dr. Luz Lex from psych and cleared for dc. Will be discharged to the care of her husband.  Discussed standard return precautions.  Discussed dangers of Tylenol.   As part of my medical decision making, I reviewed the following data within the [place] notes reviewed and incorporated, Labs reviewed , Old chart reviewed, A consult was requested and obtained from this/these consultant(s) Psychiatry, Notes from prior ED visits and [place] Controlled Substance Database     Pertinent labs & imaging results that were available during my care of the patient were reviewed by me and considered in my medical decision making (see chart for details).    ____________________________________________   FINAL CLINICAL IMPRESSION(S) / ED DIAGNOSES  Final diagnoses:  Suicidal ideation  Alcoholic intoxication with complication ([place])      NEW MEDICATIONS STARTED DURING THIS VISIT:  ED Discharge Orders    None       Note:  This document was prepared using Dragon voice recognition software and may include unintentional dictation errors.    Alfred Levins, [place], MD 07/09/18 [place], [place], MD 07/09/18 [id number]

## 2020-08-17 ENCOUNTER — Other Ambulatory Visit: Payer: Self-pay

## 2020-08-17 ENCOUNTER — Emergency Department
Admission: EM | Admit: 2020-08-17 | Discharge: 2020-08-17 | Disposition: A | Discharge status: Home / Self Care | Payer: BC Managed Care – PPO | Attending: Emergency Medicine | Admitting: Emergency Medicine

## 2020-08-17 ENCOUNTER — Ambulatory Visit
Admission: EM | Admit: 2020-08-17 | Discharge: 2020-08-17 | Disposition: A | Discharge status: Home / Self Care | Payer: BC Managed Care – PPO | Attending: Family Medicine | Admitting: Family Medicine

## 2020-08-17 DIAGNOSIS — Z7984 Long term (current) use of oral hypoglycemic drugs: Secondary | ICD-10-CM | POA: Insufficient documentation

## 2020-08-17 DIAGNOSIS — T782XXA Anaphylactic shock, unspecified, initial encounter: Secondary | ICD-10-CM

## 2020-08-17 DIAGNOSIS — F17210 Nicotine dependence, cigarettes, uncomplicated: Secondary | ICD-10-CM | POA: Diagnosis not present

## 2020-08-17 DIAGNOSIS — Z794 Long term (current) use of insulin: Secondary | ICD-10-CM | POA: Insufficient documentation

## 2020-08-17 DIAGNOSIS — E119 Type 2 diabetes mellitus without complications: Secondary | ICD-10-CM | POA: Diagnosis not present

## 2020-08-17 DIAGNOSIS — R220 Localized swelling, mass and lump, head: Secondary | ICD-10-CM | POA: Diagnosis present

## 2020-08-17 MED ORDER — EPINEPHRINE 0.3 MG/0.3ML IJ SOAJ: 0.3000 mg | INTRAMUSCULAR | 0 refills | Status: AC | PRN

## 2020-08-17 MED ORDER — EPINEPHRINE 0.3 MG/0.3ML IJ SOAJ
0.3000 mg | Freq: Once | INTRAMUSCULAR | Status: AC
  Administered 2020-08-17: 0.3 mg via INTRAMUSCULAR

## 2020-08-17 MED ORDER — METHYLPREDNISOLONE SODIUM SUCC 125 MG IJ SOLR
125.0000 mg | Freq: Once | INTRAMUSCULAR | Status: AC
  Administered 2020-08-17: 125 mg via INTRAVENOUS

## 2020-08-17 MED ORDER — PREDNISONE 10 MG PO TABS: 50.0000 mg | ORAL_TABLET | Freq: Every day | ORAL | 0 refills | Status: DC

## 2020-08-17 MED ORDER — SODIUM CHLORIDE 0.9 % IV BOLUS
1000.0000 mL | Freq: Once | INTRAVENOUS | Status: AC
  Administered 2020-08-17: 1000 mL via INTRAVENOUS

## 2020-08-17 NOTE — ED Triage Notes (Signed)
Patient brought in via ems from urgent care. Patient stung on back by bee. Took PO 75mg  benadryl. Received 125mg  solumedrol, and epi pen injection. States she just feels itchy with sore throat. On monitor

## 2020-08-17 NOTE — ED Notes (Signed)
Patient is being discharged from the Urgent Care and sent to the [facility] Emergency Department via EMS. Per Dr. Adriana Simas, patient is in need of higher level of care due to having an anaphylactic reaction. Patient is aware and verbalizes understanding of plan of care.  Vitals:   08/17/20 1204  BP: (!) 128/99  Pulse: (!) 131  Resp: (!) 36  SpO2: 99%

## 2020-08-17 NOTE — ED Provider Notes (Signed)
[facility]    CSN: [id number] Arrival date & time: 08/17/20  1203      History   Chief Complaint Chief Complaint  Patient presents with   Allergic Reaction    HPI 55 year old female presents with acute emergency.  Patient experiencing anaphylaxis.  History obtained by the husband as patient is struggling to breathe on arrival.  Per the husband, she was mowing grass and was stung by a suspected bee.  She subsequently developed rash, itching, and difficulty breathing.   *On initial impression, patient having difficulty breathing with swelling of the lips.  Diffuse rash.  Epi was given quickly to the right thigh and patient was placed on nasal cannula O2.  Patient subsequently had improvement in respiratory distress.   Past Medical History:  Diagnosis Date   Depression    Diabetes mellitus without complication (HCC)    Past Surgical History:  Procedure Laterality Date   ABDOMINAL HYSTERECTOMY      OB History   No obstetric history on file.      Home Medications    Prior to Admission medications   Medication Sig Start Date End Date Taking? Authorizing Provider  diclofenac (VOLTAREN) 75 MG EC tablet Take 1 tablet (75 mg total) by mouth 2 (two) times daily. Take with food 09/13/14   Domenick Gong, MD  gabapentin (NEURONTIN) 100 MG capsule Take 100 mg by mouth 3 (three) times daily.    [provider]  ibuprofen (ADVIL,MOTRIN) 400 MG tablet Take 1 tablet (400 mg total) by mouth every 6 (six) hours as needed. 12/10/15   Jennye Moccasin, MD  insulin glargine (LANTUS) 100 UNIT/ML injection Inject 20 Units into the skin at bedtime.    [provider]  insulin NPH Human (HUMULIN N,NOVOLIN N) 100 UNIT/ML injection Inject 8 Units into the skin.    [provider]  metFORMIN (GLUCOPHAGE) 500 MG tablet Take by mouth 2 (two) times daily with a meal.    [provider]  predniSONE (DELTASONE) 20 MG tablet Take 3 tabs po on first day,  2 tabs second day, 2 tabs third day, 1 tab fourth day, 1 tab 5th day. Take with food. 09/13/14   Domenick Gong, MD  sertraline (ZOLOFT) 50 MG tablet Take 50 mg by mouth daily.    [provider]  tiZANidine (ZANAFLEX) 4 MG tablet Take 1 tablet (4 mg total) by mouth every 8 (eight) hours as needed for muscle spasms. 09/13/14   Domenick Gong, MD    Family History History reviewed. No pertinent family history.  Social History Social History   Tobacco Use   Smoking status: Every Day    Packs/day: 1.00    Types: Cigarettes   Smokeless tobacco: Never  Substance Use Topics   Alcohol use: Yes   Drug use: Never     Allergies   Codeine   Review of Systems Review of Systems  Unable to perform ROS: Severe respiratory distress   Physical Exam Triage Vital Signs ED Triage Vitals  Enc Vitals Group     BP 08/17/20 1204 (!) 128/99     Pulse Rate 08/17/20 1204 (!) 131     Resp 08/17/20 1204 (!) 36     Temp --      Temp src --      SpO2 08/17/20 1204 99 %     Weight 08/17/20 1207 212 lb (96.2 kg)     Height 08/17/20 1207 5\' 6"  (1.676 m)     Head  Circumference --      Peak Flow --      Pain Score 08/17/20 1207 0     Pain Loc --      Pain Edu? --      Excl. in GC? --    No data found.  Updated Vital Signs BP (!) 128/99 (BP Location: Right Arm)   Pulse (!) 131   Resp (!) 36   Ht 5\' 6"  (1.676 m)   Wt 96.2 kg   SpO2 99%   BMI 34.22 kg/m   Visual Acuity Right Eye Distance:   Left Eye Distance:   Bilateral Distance:    Right Eye Near:   Left Eye Near:    Bilateral Near:     Physical Exam Constitutional:      Comments: Patient is awake but on initial exam is in severe respiratory distress.  HENT:     Mouth/Throat:     Comments: Swelling of the lips noted. Eyes:     General:        Right eye: No discharge.        Left eye: No discharge.     Conjunctiva/sclera: Conjunctivae normal.  Cardiovascular:     Rate and Rhythm: Regular rhythm. Tachycardia  present.  Pulmonary:     Effort: Respiratory distress present.  Skin:    Comments: Diffuse erythematous rash.    UC Treatments / Results  Labs (all labs ordered are listed, but only abnormal results are displayed) Labs Reviewed - No data to display  EKG   Radiology No results found.  Procedures Procedures (including critical care time)  Medications Ordered in UC Medications  sodium chloride 0.9 % bolus 1,000 mL (1,000 mLs Intravenous New Bag/Given 08/17/20 1223)  methylPREDNISolone sodium succinate (SOLU-MEDROL) 125 mg/2 mL injection 125 mg (125 mg Intravenous Given 08/17/20 1223)  EPINEPHrine (EPI-PEN) injection 0.3 mg (0.3 mg Intramuscular Given 08/17/20 1200)    Initial Impression / Assessment and Plan / UC Course  I have reviewed the triage vital signs and the nursing notes.  Pertinent labs & imaging results that were available during my care of the patient were reviewed by me and considered in my medical decision making (see chart for details).    55 year old female presents with anaphylaxis.  This is an acute life-threatening emergency.  Patient was given epinephrine and placed on nasal cannula oxygen.  Patient subsequently had resolution of respiratory distress.  Patient still experiencing diffuse rash and itching.  Also having some lip swelling.  IV placed and IV fluids given.  Solu-Medrol given as well.  Patient took 3 Benadryl prior to arrival per the husband.  Patient not hypotensive.  No nausea or vomiting.  No diarrhea.  Patient is being transported to the hospital via EMS for continued monitoring and treatment.    Final Clinical Impressions(s) / UC Diagnoses   Final diagnoses:  Anaphylaxis, initial encounter   Discharge Instructions   None    ED Prescriptions   None    PDMP not reviewed this encounter.   40, Tommie Sams 08/17/20 1244

## 2020-08-17 NOTE — ED Provider Notes (Signed)
[facility] Emergency Department Provider Note ____________________________________________   Event Date/Time   First MD Initiated Contact with Patient 08/17/20 1301     (approximate)  I have reviewed the triage vital signs and the nursing notes.   HISTORY  Chief Complaint Allergic Reaction  HPI Robyn Sanchez is a 55 y.o. female with history of diabetes presents to the emergency department for treatment and evaluation after being stung by a bee while push mowing the grass. She noticed hives and facial swelling soon after. No relief with 75mg  of Benadryl. She had presented to urgent care who established IV and gave 125 Solumedrol and EpiPen then transported via EMS to ER. She received Pepcid enroute.          Past Medical History:  Diagnosis Date   Depression    Diabetes mellitus without complication (HCC)     There are no problems to display for this patient.   Past Surgical History:  Procedure Laterality Date   ABDOMINAL HYSTERECTOMY      Prior to Admission medications   Medication Sig Start Date End Date Taking? Authorizing Provider  EPINEPHrine (EPIPEN 2-PAK) 0.3 mg/0.3 mL IJ SOAJ injection Inject 0.3 mg into the muscle as needed for anaphylaxis. 08/17/20  Yes Avigdor Dollar B, FNP  predniSONE (DELTASONE) 10 MG tablet Take 5 tablets (50 mg total) by mouth daily. 08/17/20  Yes Lum Stillinger B, FNP  diclofenac (VOLTAREN) 75 MG EC tablet Take 1 tablet (75 mg total) by mouth 2 (two) times daily. Take with food 09/13/14   09/15/14, MD  gabapentin (NEURONTIN) 100 MG capsule Take 100 mg by mouth 3 (three) times daily.    [provider]  ibuprofen (ADVIL,MOTRIN) 400 MG tablet Take 1 tablet (400 mg total) by mouth every 6 (six) hours as needed. 12/10/15   12/12/15, MD  insulin glargine (LANTUS) 100 UNIT/ML injection Inject 20 Units into the skin at bedtime.    [provider]  insulin NPH Human (HUMULIN  N,NOVOLIN N) 100 UNIT/ML injection Inject 8 Units into the skin.    [provider]  metFORMIN (GLUCOPHAGE) 500 MG tablet Take by mouth 2 (two) times daily with a meal.    [provider]  sertraline (ZOLOFT) 50 MG tablet Take 50 mg by mouth daily.    [provider]  tiZANidine (ZANAFLEX) 4 MG tablet Take 1 tablet (4 mg total) by mouth every 8 (eight) hours as needed for muscle spasms. 09/13/14   09/15/14, MD    Allergies Codeine  History reviewed. No pertinent family history.  Social History Social History   Tobacco Use   Smoking status: Every Day    Packs/day: 1.00    Types: Cigarettes   Smokeless tobacco: Never  Substance Use Topics   Alcohol use: Yes   Drug use: Never    Review of Systems  Constitutional: No fever/chills Eyes: No visual changes. ENT: No sore throat. Cardiovascular: Denies chest pain. Respiratory: Denies shortness of breath. Gastrointestinal: No abdominal pain.  No nausea, no vomiting.   Genitourinary: Negative for dysuria. Musculoskeletal: Negative for back pain. Skin: Negative for urticaria; positive for insect sting right lower back. Neurological: Negative for headaches, focal weakness or numbness. ____________________________________________   PHYSICAL EXAM:  VITAL SIGNS: ED Triage Vitals [08/17/20 1309]  Enc Vitals Group     BP 129/75     Pulse Rate (!) 106     Resp 16     Temp 99 F (37.2 C)  Temp Source Oral     SpO2 96 %     Weight      Height      Head Circumference      Peak Flow      Pain Score      Pain Loc      Pain Edu?      Excl. in GC?     Constitutional: Alert and oriented. Well appearing and in no acute distress. Eyes: Conjunctivae are normal.  Head: Atraumatic. Nose: No congestion/rhinnorhea. Mouth/Throat: Airway is patent; lips swollen; Mucous membranes are moist.  Oropharynx non-erythematous. Uvula midline and without edema. Neck: No stridor.    Hematological/Lymphatic/Immunilogical: No cervical lymphadenopathy. Cardiovascular: Tachycardic rate, regular rhythm. Grossly normal heart sounds.  Good peripheral circulation. Respiratory: Normal respiratory effort.  No retractions. Lungs CTAB. Gastrointestinal: Soft and nontender. No distention. No abdominal bruits.  Genitourinary:  Musculoskeletal: No lower extremity tenderness nor edema.  No joint effusions. Neurologic:  Normal speech and language. No gross focal neurologic deficits are appreciated. No gait instability. Skin:  Skin is warm, dry and intact. Dissipating urticaria on face and trunk. Psychiatric: Mood and affect are normal. Speech and behavior are normal.  ____________________________________________   LABS (all labs ordered are listed, but only abnormal results are displayed)  Labs Reviewed - No data to display ____________________________________________  EKG  Not indicated. ____________________________________________  RADIOLOGY  ED MD interpretation:    Not indicated.  I, Kem Boroughs, personally viewed and evaluated these images (plain radiographs) as part of my medical decision making, as well as reviewing the written report by the radiologist.  Official radiology report(s): No results found.  ____________________________________________   PROCEDURES  Procedure(s) performed (including Critical Care):  Procedures  ____________________________________________   INITIAL IMPRESSION / ASSESSMENT AND PLAN     55 year old female presenting to the emergency department for treatment and evaluation after being stung by a bee.  She is unsure what type of bee".  She has never had an allergic reaction in the past.  See HPI for further details.  DIFFERENTIAL DIAGNOSIS  Allergic reaction, anaphylaxis  ED COURSE  Patient has received IV fluids, Solu-Medrol, Benadryl, Pepcid, and epinephrine prior to ER arrival.  Plan will be to monitor her closely to  ensure that the edema in the lips and mouth does not rebound.  Patient aware and agrees to the plan.  Patient requesting discharge and states that she is leaving regardless. She is just inside the monitoring window after Epi administration. Prescriptions sent to her pharmacy for prednisone and EpiPen. She was given strict ER return precautions.     ___________________________________________   FINAL CLINICAL IMPRESSION(S) / ED DIAGNOSES  Final diagnoses:  Anaphylaxis, initial encounter     ED Discharge Orders          Ordered    predniSONE (DELTASONE) 10 MG tablet  Daily        08/17/20 1525    EPINEPHrine (EPIPEN 2-PAK) 0.3 mg/0.3 mL IJ SOAJ injection  As needed        08/17/20 1525             Thailyn Khalid was evaluated in Emergency Department on 08/17/2020 for the symptoms described in the history of present illness. She was evaluated in the context of the global COVID-19 pandemic, which necessitated consideration that the patient might be at risk for infection with the SARS-CoV-2 virus that causes COVID-19. Institutional protocols and algorithms that pertain to the evaluation of patients at risk for COVID-19 are  in a state of rapid change based on information released by regulatory bodies including the CDC and federal and state organizations. These policies and algorithms were followed during the patient's care in the ED.   Note:  This document was prepared using Dragon voice recognition software and may include unintentional dictation errors.    Chinita Pester, FNP 08/17/20 1530    Concha Se, MD 08/18/20 [phone]

## 2020-08-17 NOTE — ED Triage Notes (Signed)
Patient states that she was mowing the grass and something stung her on her back. Patient came in to UC with difficulty breathing and epinephrine was given at 12pm.

## 2021-09-25 ENCOUNTER — Ambulatory Visit
Admission: RE | Admit: 2021-09-25 | Discharge: 2021-09-25 | Disposition: A | Discharge status: Home / Self Care | Payer: BC Managed Care – PPO | Source: Ambulatory Visit | Attending: Emergency Medicine | Admitting: Emergency Medicine

## 2021-09-25 ENCOUNTER — Ambulatory Visit (INDEPENDENT_AMBULATORY_CARE_PROVIDER_SITE_OTHER): Payer: BC Managed Care – PPO

## 2021-09-25 VITALS — BP 136/76 | HR 89 | Temp 98.4°F | Resp 18

## 2021-09-25 DIAGNOSIS — M25461 Effusion, right knee: Secondary | ICD-10-CM | POA: Diagnosis not present

## 2021-09-25 DIAGNOSIS — M25561 Pain in right knee: Secondary | ICD-10-CM

## 2021-09-25 MED ORDER — HYDROCODONE-ACETAMINOPHEN 5-325 MG PO TABS: 1.0000 | ORAL_TABLET | Freq: Four times a day (QID) | ORAL | 0 refills | Status: AC | PRN

## 2021-09-25 MED ORDER — ACETAMINOPHEN 500 MG PO TABS
1000.0000 mg | ORAL_TABLET | Freq: Once | ORAL | Status: AC
  Administered 2021-09-25: 1000 mg via ORAL

## 2021-09-25 MED ORDER — NAPROXEN 500 MG PO TABS: 500.0000 mg | ORAL_TABLET | Freq: Two times a day (BID) | ORAL | 0 refills | Status: AC

## 2021-09-25 MED ORDER — KETOROLAC TROMETHAMINE 60 MG/2ML IM SOLN
30.0000 mg | Freq: Once | INTRAMUSCULAR | Status: AC
  Administered 2021-09-25: 30 mg via INTRAMUSCULAR

## 2021-09-25 NOTE — Discharge Instructions (Addendum)
Call your orthopedic surgeon or follow-up with [facility] ASAP.  Take the Naprosyn with a Tylenol containing product twice a day.  Either take the Naprosyn with 1000 mg of Tylenol, or take Naprosyn with 1-2 Norco.  Continue ice.  Wear the knee brace as needed for comfort.

## 2021-09-25 NOTE — ED Provider Notes (Signed)
HPI  SUBJECTIVE:  Robyn Sanchez is a 56 y.o. female who presents with constant dull, aching, posterior knee pain starting last night.  States that she was standing on her right knee in her kitchen, it gave out, she felt a pop immediately followed by pain.  Now she cannot extend or flex it fully.  She denies trauma to the knee, fall, or placing torque on her knee.  No immediate swelling, distal numbness or tingling, fevers, erythema, increased temperature of the joint, distal color changes.  No recent antibiotics, specifically fluoroquinolones.  She tried heat, ice, elevation, ibuprofen 600 mg and a walker and crutches without improvement in her symptoms.  No alleviating factors.  Symptoms are worse with/extension and weightbearing.  She states she is unable to weight-bear at all.  She has a past medical history of diabetes.  No history of right knee injury.  Orthopedics: [facility].   Past Medical History:  Diagnosis Date   Depression    Diabetes mellitus without complication (HCC)     Past Surgical History:  Procedure Laterality Date   ABDOMINAL HYSTERECTOMY     KNEE SURGERY Left     History reviewed. No pertinent family history.  Social History   Tobacco Use   Smoking status: Every Day    Packs/day: 1.00    Types: Cigarettes   Smokeless tobacco: Never  Vaping Use   Vaping Use: Never used  Substance Use Topics   Alcohol use: Yes   Drug use: Never     Current Facility-Administered Medications:    ketorolac (TORADOL) injection 30 mg, 30 mg, Intramuscular, Once, Domenick Gong, MD  Current Outpatient Medications:    EPINEPHrine (EPIPEN 2-PAK) 0.3 mg/0.3 mL IJ SOAJ injection, Inject 0.3 mg into the muscle as needed for anaphylaxis., Disp: 1 each, Rfl: 0   gabapentin (NEURONTIN) 100 MG capsule, Take 100 mg by mouth 3 (three) times daily., Disp: , Rfl:    HYDROcodone-acetaminophen (NORCO/VICODIN) 5-325 MG tablet, Take 1-2 tablets by mouth every 6 (six) hours as  needed for moderate pain or severe pain., Disp: 12 tablet, Rfl: 0   naproxen (NAPROSYN) 500 MG tablet, Take 1 tablet (500 mg total) by mouth 2 (two) times daily., Disp: 20 tablet, Rfl: 0   SEMGLEE, YFGN, 100 UNIT/ML Pen, Inject into the skin., Disp: , Rfl:    sertraline (ZOLOFT) 50 MG tablet, Take 50 mg by mouth daily., Disp: , Rfl:    insulin glargine (LANTUS) 100 UNIT/ML injection, Inject 20 Units into the skin at bedtime., Disp: , Rfl:    insulin NPH Human (HUMULIN N,NOVOLIN N) 100 UNIT/ML injection, Inject 8 Units into the skin., Disp: , Rfl:    metFORMIN (GLUCOPHAGE) 500 MG tablet, Take by mouth 2 (two) times daily with a meal., Disp: , Rfl:   Allergies  Allergen Reactions   Bee Venom Anaphylaxis    Yellow jacket vs hornet    Codeine Nausea And Vomiting     ROS  As noted in HPI.   Physical Exam  BP 136/76 (BP Location: Left Arm)   Pulse 89   Temp 98.4 F (36.9 C) (Oral)   Resp 18   SpO2 99%   Constitutional: Well developed, well nourished, in moderate painful distress.  Crying. Eyes:  EOMI, conjunctiva normal bilaterally HENT: Normocephalic, atraumatic,mucus membranes moist Respiratory: Normal inspiratory effort Cardiovascular: Normal rate GI: nondistended skin: No rash, skin intact Musculoskeletal: R right knee range of motion decreased due to pain, unable to flex to 90 degrees, unable to  extend fully, suprapatellar region tender,, medial and lateral retinaculum tender,  patella NT, Patellar tendon NT, Medial joint tender, Lateral joint tender, Popliteal region tender, patient had difficulty tolerating varus/valgus, ACL/PCL stress, pain with stress on the meniscus, Lachman's positive. Distal NVI with intact baseline sensation / motor / pulse distal to knee.  No appreciable effusion. No erythema. No increased temperature. No crepitus..  Calves symmetric, no edema, tenderness, palpable cord. Neurologic: Alert & oriented x 3, no focal neuro deficits Psychiatric: Speech and  behavior appropriate   ED Course   Medications  ketorolac (TORADOL) injection 30 mg (has no administration in time range)  acetaminophen (TYLENOL) tablet 1,000 mg (1,000 mg Oral Given 09/25/21 1628)    Orders Placed This Encounter  Procedures   DG Knee Complete 4 Views Right    Standing Status:   Standing    Number of Occurrences:   1    Order Specific Question:   Reason for Exam (SYMPTOM  OR DIAGNOSIS REQUIRED)    Answer:   Pain, No injury   Apply hinged knee brace    Standing Status:   Standing    Number of Occurrences:   1    Order Specific Question:   Laterality    Answer:   Right    No results found for this or any previous visit (from the past 24 hour(s)). DG Knee Complete 4 Views Right  Result Date: 09/25/2021 CLINICAL DATA:  Knee pain since last night.  No known injury. EXAM: RIGHT KNEE - COMPLETE 4+ VIEW COMPARISON:  None Available. FINDINGS: The mineralization and alignment are normal. There is no evidence of acute fracture or dislocation. The joint spaces are preserved. Possible small joint effusion. No foreign body or focal soft tissue swelling. IMPRESSION: No acute osseous findings or significant arthropathic changes. Possible small nonspecific joint effusion. Electronically Signed   By: Carey Bullocks M.D.   On: 09/25/2021 15:59    ED Clinical Impression  1. Effusion of right knee   2. Acute pain of right knee      ED Assessment/Plan  [place] Narcotic database reviewed for this patient, and feel that the risk/benefit ratio today is favorable for proceeding with a prescription for controlled substance.  1 opiate prescription in the past year from her orthopedic surgeon.  Reviewed imaging independently.  Possible nonspecific joint effusion.  No fracture, dislocation.  See radiology report for full details.  X-ray negative for fracture, dislocation.  She may have a small effusion, which is nonspecific per radiology.    Patient crying, appears to be in a great deal of  pain.  I am unable to get a very good exam due to patient discomfort.  However her knee is not hot, red, her calf is not swollen or tender, there is no lower extremity edema.  Doubt joint infection, gout or DVT.  Doubt ruptured Baker's cyst.  Symptoms started immediately after knee giving way and falling.  Giving Tylenol 1000 mg, Toradol 30 mg IM, placing in a hinged knee brace.  She has crutches in the car and a walker at home.  She is to call her orthopedic surgeon ASAP and get an appointment, or may follow-up with [facility] ASAP.  Work note for today, tomorrow and Monday-spends a lot of time on her feet.  She does not work over the weekend.  ER return precautions given.  Discussed imaging, MDM, treatment plan, and plan for follow-up with patient and family member.  Discussed sn/sx that should prompt return to the  ED. they agree with plan.   Meds ordered this encounter  Medications   acetaminophen (TYLENOL) tablet 1,000 mg   ketorolac (TORADOL) injection 30 mg   naproxen (NAPROSYN) 500 MG tablet    Sig: Take 1 tablet (500 mg total) by mouth 2 (two) times daily.    Dispense:  20 tablet    Refill:  0   HYDROcodone-acetaminophen (NORCO/VICODIN) 5-325 MG tablet    Sig: Take 1-2 tablets by mouth every 6 (six) hours as needed for moderate pain or severe pain.    Dispense:  12 tablet    Refill:  0      *This clinic note was created using Scientist, clinical (histocompatibility and immunogenetics). Therefore, there may be occasional mistakes despite careful proofreading.  ?    Domenick Gong, MD 09/25/21 1743

## 2021-09-25 NOTE — ED Triage Notes (Signed)
Pt presents with right knee pain that started last night. Pt states she was standing in the kitchen and her knee gave out. She denies any injury. Pt does report having a left knee replacement last year

## 2022-11-03 ENCOUNTER — Telehealth: Payer: Self-pay

## 2022-11-03 NOTE — Telephone Encounter (Signed)
Patient called in to check if we received her referral and if so she wanted to schedule appointment. I informed her we have not received a referral she will have to call her appointment.

## 2022-11-11 ENCOUNTER — Ambulatory Visit: Payer: Self-pay

## 2022-11-11 ENCOUNTER — Ambulatory Visit
Admission: RE | Admit: 2022-11-11 | Discharge: 2022-11-11 | Disposition: A | Discharge status: Home / Self Care | Payer: BC Managed Care – PPO | Source: Ambulatory Visit

## 2022-11-11 VITALS — BP 111/69 | HR 89 | Temp 98.6°F | Resp 21

## 2022-11-11 DIAGNOSIS — M545 Low back pain, unspecified: Secondary | ICD-10-CM

## 2022-11-11 DIAGNOSIS — M533 Sacrococcygeal disorders, not elsewhere classified: Secondary | ICD-10-CM | POA: Diagnosis not present

## 2022-11-11 NOTE — ED Triage Notes (Signed)
Patient was roller skating Saturday and fell. lower back and pelvic pain.

## 2022-11-11 NOTE — Discharge Instructions (Addendum)
Please go to [facility] for further evaluation of your low back and pelvic pain since we are unable to perform imaging at this location at this time.

## 2022-11-11 NOTE — ED Provider Notes (Signed)
[facility]    CSN: [id number] Arrival date & time: 11/11/22  1430      History   Chief Complaint Chief Complaint  Patient presents with   Back Pain    HPI Robyn Sanchez is a 57 y.o. female.   HPI  57 year old female with a past medical history significant for diabetes and depression presents for evaluation of significant low back and sacral pain after suffering a ground-level fall 4 days ago.  She reports that she was not able to get herself up on her own at that time and that she has pain with sitting, laying, standing, and ambulation.  She was in significant pain last night and called EMS but then refused transport to the ER.  Past Medical History:  Diagnosis Date   Depression    Diabetes mellitus without complication (HCC)     There are no problems to display for this patient.   Past Surgical History:  Procedure Laterality Date   ABDOMINAL HYSTERECTOMY     KNEE SURGERY Left     OB History   No obstetric history on file.      Home Medications    Prior to Admission medications   Medication Sig Start Date End Date Taking? Authorizing Provider  cyclobenzaprine (FLEXERIL) 5 MG tablet Take 5-10 mg by mouth at bedtime.   Yes [provider]  gabapentin (NEURONTIN) 100 MG capsule Take 100 mg by mouth 3 (three) times daily.   Yes [provider]  SEMGLEE, YFGN, 100 UNIT/ML Pen Inject into the skin. 09/18/21  Yes [provider]  sertraline (ZOLOFT) 50 MG tablet Take 50 mg by mouth daily.   Yes [provider]  EPINEPHrine (EPIPEN 2-PAK) 0.3 mg/0.3 mL IJ SOAJ injection Inject 0.3 mg into the muscle as needed for anaphylaxis. 08/17/20   Triplett, Rulon Eisenmenger B, FNP  HYDROcodone-acetaminophen (NORCO/VICODIN) 5-325 MG tablet Take 1-2 tablets by mouth every 6 (six) hours as needed for moderate pain or severe pain. 09/25/21   Domenick Gong, MD  insulin glargine (LANTUS) 100 UNIT/ML injection Inject 20 Units into the skin  at bedtime.    [provider]  insulin NPH Human (HUMULIN N,NOVOLIN N) 100 UNIT/ML injection Inject 8 Units into the skin.    [provider]  metFORMIN (GLUCOPHAGE) 500 MG tablet Take by mouth 2 (two) times daily with a meal.    [provider]  naproxen (NAPROSYN) 500 MG tablet Take 1 tablet (500 mg total) by mouth 2 (two) times daily. 09/25/21   Domenick Gong, MD    Family History History reviewed. No pertinent family history.  Social History Social History   Tobacco Use   Smoking status: Every Day    Current packs/day: 1.00    Types: Cigarettes   Smokeless tobacco: Never  Vaping Use   Vaping status: Never Used  Substance Use Topics   Alcohol use: Yes   Drug use: Never     Allergies   Bee venom and Codeine   Review of Systems Review of Systems  Musculoskeletal:  Positive for back pain.  Neurological:  Negative for weakness and numbness.     Physical Exam Triage Vital Signs ED Triage Vitals  Encounter Vitals Group     BP 11/11/22 1502 111/69     Systolic BP Percentile --      Diastolic BP Percentile --      Pulse Rate 11/11/22 1502 89     Resp 11/11/22 1502 (!) 21  Temp 11/11/22 1502 98.6 F (37 C)     Temp Source 11/11/22 1502 Oral     SpO2 11/11/22 1502 95 %     Weight --      Height --      Head Circumference --      Peak Flow --      Pain Score 11/11/22 1501 10     Pain Loc --      Pain Education --      Exclude from Growth Chart --    No data found.  Updated Vital Signs BP 111/69 (BP Location: Left Arm)   Pulse 89   Temp 98.6 F (37 C) (Oral)   Resp (!) 21   SpO2 95%   Visual Acuity Right Eye Distance:   Left Eye Distance:   Bilateral Distance:    Right Eye Near:   Left Eye Near:    Bilateral Near:     Physical Exam Vitals and nursing note reviewed.  Constitutional:      General: She is in acute distress.     Comments: Patient appears to be in a moderate degree of pain.  Neurological:      General: No focal deficit present.     Mental Status: She is alert and oriented to person, place, and time.      UC Treatments / Results  Labs (all labs ordered are listed, but only abnormal results are displayed) Labs Reviewed - No data to display  EKG   Radiology No results found.  Procedures Procedures (including critical care time)  Medications Ordered in UC Medications - No data to display  Initial Impression / Assessment and Plan / UC Course  I have reviewed the triage vital signs and the nursing notes.  Pertinent labs & imaging results that were available during my care of the patient were reviewed by me and considered in my medical decision making (see chart for details).   Patient is a pleasant 57 year old female who appears to be in a moderate degree of pain presenting for evaluation of pain in her sacral area and lumbar spine after suffering a ground-level fall onto a carpeted concrete surface at the roller rink 4 days ago.  She is endorsing pain with ambulation, standing, sitting, or laying.  She did have quite a difficult time transitioning from sitting to standing from the chair in the exam room.  I have advised her that she needs radiographic imaging of her lumbar spine and sacrum which we cannot do at this time as our x-ray machine is down.  I have suggested that she go to [facility] here in [facility] as they have a walk-in urgent care where they can perform the needed imaging.  There is concern for possible coccyx fracture, pelvic wing fracture, or compression fracture of the lumbar spine, none of which can be ruled out in this environment at this time.   Final Clinical Impressions(s) / UC Diagnoses   Final diagnoses:  Acute low back pain, unspecified back pain laterality, unspecified whether sciatica present  Sacral pain     Discharge Instructions      Please go to [facility] for further evaluation of your low back and pelvic pain since we are unable to  perform imaging at this location at this time.     ED Prescriptions   None    PDMP not reviewed this encounter.   Becky Augusta, NP 11/11/22 [phone]

## 2022-12-25 ENCOUNTER — Telehealth: Payer: Self-pay | Admitting: Gastroenterology

## 2022-12-25 NOTE — Telephone Encounter (Signed)
I called the patient to confirm her appointment with Dr. Tobi Bastos. The patient requested a EGD as well.

## 2022-12-28 ENCOUNTER — Other Ambulatory Visit: Payer: Self-pay

## 2022-12-28 ENCOUNTER — Ambulatory Visit (INDEPENDENT_AMBULATORY_CARE_PROVIDER_SITE_OTHER): Payer: BC Managed Care – PPO | Admitting: Gastroenterology

## 2022-12-28 ENCOUNTER — Encounter: Payer: Self-pay | Admitting: Gastroenterology

## 2022-12-28 VITALS — BP 150/83 | HR 82 | Temp 98.3°F | Ht 66.0 in | Wt 225.0 lb

## 2022-12-28 DIAGNOSIS — Z860101 Personal history of adenomatous and serrated colon polyps: Secondary | ICD-10-CM

## 2022-12-28 DIAGNOSIS — K2100 Gastro-esophageal reflux disease with esophagitis, without bleeding: Secondary | ICD-10-CM

## 2022-12-28 DIAGNOSIS — Z8719 Personal history of other diseases of the digestive system: Secondary | ICD-10-CM | POA: Diagnosis not present

## 2022-12-28 DIAGNOSIS — Z1211 Encounter for screening for malignant neoplasm of colon: Secondary | ICD-10-CM

## 2022-12-28 DIAGNOSIS — Z09 Encounter for follow-up examination after completed treatment for conditions other than malignant neoplasm: Secondary | ICD-10-CM | POA: Diagnosis not present

## 2022-12-28 MED ORDER — NA SULFATE-K SULFATE-MG SULF 17.5-3.13-1.6 GM/177ML PO SOLN
354.0000 mL | Freq: Once | ORAL | 0 refills | Status: AC
Start: 2022-12-28 — End: 2022-12-28

## 2022-12-28 MED ORDER — OMEPRAZOLE 40 MG PO CPDR: 40.0000 mg | DELAYED_RELEASE_CAPSULE | Freq: Every day | ORAL | 0 refills | Status: DC

## 2022-12-28 NOTE — Progress Notes (Signed)
Wyline Mood MD, MRCP(U.K) [address]  [address]  [place], [place] [address]  Main: [phone]  Fax: [phone]   Gastroenterology Consultation  Referring Provider:     Noralyn Pick, NP Primary Care Physician:  Noralyn Pick, NP Primary Gastroenterologist:  Dr. Wyline Mood  Reason for Consultation:     EGD+colonoscopy        HPI:   Robyn Sanchez is a 57 y.o. y/o female referred for consultation & management  by  Noralyn Pick, NP.    She is due for a screening colonoscopy hence is here to see me for the same no lower GI issues denies any diarrhea.  She says that she has been having burning sensation in her throat for the past 1 month hence would like to have an upper endoscopy as well.  She has not had a trial of PPI.  The heartburn that she describes is just a burning sensation after meals not particularly worse after bedtime does not wake her up from sleep denies any weight gain or weight loss recently.  No similar complaint in the past denies any dysphagia. 11/2017: Colonoscopy : Biopsies of colon showed features of lymphocytic colitis, sessile serrated adenoma  Past Medical History:  Diagnosis Date   Depression    Diabetes mellitus without complication (HCC)     Past Surgical History:  Procedure Laterality Date   ABDOMINAL HYSTERECTOMY     KNEE SURGERY Left     Prior to Admission medications   Medication Sig Start Date End Date Taking? Authorizing Provider  cyclobenzaprine (FLEXERIL) 5 MG tablet Take 5-10 mg by mouth at bedtime.    [provider]  EPINEPHrine (EPIPEN 2-PAK) 0.3 mg/0.3 mL IJ SOAJ injection Inject 0.3 mg into the muscle as needed for anaphylaxis. 08/17/20   Triplett, Cari B, FNP  gabapentin (NEURONTIN) 100 MG capsule Take 100 mg by mouth 3 (three) times daily.    [provider]  HYDROcodone-acetaminophen (NORCO/VICODIN) 5-325 MG tablet Take 1-2 tablets by mouth every 6 (six) hours as needed for moderate  pain or severe pain. 09/25/21   Domenick Gong, MD  insulin glargine (LANTUS) 100 UNIT/ML injection Inject 20 Units into the skin at bedtime.    [provider]  insulin NPH Human (HUMULIN N,NOVOLIN N) 100 UNIT/ML injection Inject 8 Units into the skin.    [provider]  metFORMIN (GLUCOPHAGE) 500 MG tablet Take by mouth 2 (two) times daily with a meal.    [provider]  naproxen (NAPROSYN) 500 MG tablet Take 1 tablet (500 mg total) by mouth 2 (two) times daily. 09/25/21   Domenick Gong, MD  SEMGLEE, YFGN, 100 UNIT/ML Pen Inject into the skin. 09/18/21   [provider]  sertraline (ZOLOFT) 50 MG tablet Take 50 mg by mouth daily.    [provider]    No family history on file.   Social History   Tobacco Use   Smoking status: Every Day    Current packs/day: 1.00    Types: Cigarettes   Smokeless tobacco: Never  Vaping Use   Vaping status: Never Used  Substance Use Topics   Alcohol use: Yes   Drug use: Never    Allergies as of 12/28/2022 - Review Complete 12/28/2022  Allergen Reaction Noted   Bee venom Anaphylaxis 09/17/2020   Codeine Nausea And Vomiting 09/13/2014    Review of Systems:    All systems reviewed and negative except where noted in HPI.  Physical Exam:  BP (!) 150/83   Pulse 82   Temp 98.3 F (36.8 C) (Oral)   Ht 5\' 6"  (1.676 m)   Wt 225 lb (102.1 kg)   BMI 36.32 kg/m  No LMP recorded. Patient has had a hysterectomy. Psych:  Alert and cooperative. Normal mood and affect. General:   Alert,  Well-developed, well-nourished, pleasant and cooperative in NAD Head:  Normocephalic and atraumatic. Eyes:  Sclera clear, no icterus.   Conjunctiva pink. Neurologic:  Alert and oriented x3;  grossly normal neurologically. Psych:  Alert and cooperative. Normal mood and affect.  Imaging Studies: No results found.  Assessment and Plan:   Robyn Sanchez is a 57 y.o. y/o female has been referred for a  screening colonoscopy prior history of lymphocytic colitis and sessile serrated polyp of the colon.  Denies any diarrhea presently.  She also has a short history of heartburn.  Has not had a trial of PPI.  She was thinking if she would require an EGD.  Denies any weight loss or dysphagia.  Plan 1.  Schedule screening colonoscopy in 9 to 10 weeks 2.  Trial of PPI for reflux along with patient information about lifestyle changes provided and discussed at the office.  She will return to our office in 8 weeks to have a visit with Celso Amy our PA if she is feeling better can continue the PPI and reduce the dose gradually if not better we will proceed with EGD added onto her scheduled colonoscopy. She was initially reluctant to schedule her procedures due to huge co-pay but has agreed to proceed at this point of time  I have discussed alternative options, risks & benefits,  which include, but are not limited to, bleeding, infection, perforation,respiratory complication & drug reaction.  The patient agrees with this plan & written consent will be obtained.     Follow up in 8 weeks with Inetta Fermo   Dr Wyline Mood MD,MRCP(U.K)

## 2022-12-28 NOTE — Patient Instructions (Signed)
Gastroesophageal Reflux Disease, Adult  Gastroesophageal reflux (GER) happens when acid from the stomach flows up into the tube that connects the mouth and the stomach (esophagus). Normally, food travels down the esophagus and stays in the stomach to be digested. With GER, food and stomach acid sometimes move back up into the esophagus. You may have a disease called gastroesophageal reflux disease (GERD) if the reflux: Happens often. Causes frequent or very bad symptoms. Causes problems such as damage to the esophagus. When this happens, the esophagus becomes sore and swollen. Over time, GERD can make small holes (ulcers) in the lining of the esophagus. What are the causes? This condition is caused by a problem with the muscle between the esophagus and the stomach. When this muscle is weak or not normal, it does not close properly to keep food and acid from coming back up from the stomach. The muscle can be weak because of: Tobacco use. Pregnancy. Having a certain type of hernia (hiatal hernia). Alcohol use. Certain foods and drinks, such as coffee, chocolate, onions, and peppermint. What increases the risk? Being overweight. Having a disease that affects your connective tissue. Taking NSAIDs, such a ibuprofen. What are the signs or symptoms? Heartburn. Difficult or painful swallowing. The feeling of having a lump in the throat. A bitter taste in the mouth. Bad breath. Having a lot of saliva. Having an upset or bloated stomach. Burping. Chest pain. Different conditions can cause chest pain. Make sure you see your doctor if you have chest pain. Shortness of breath or wheezing. A long-term cough or a cough at night. Wearing away of the surface of teeth (tooth enamel). Weight loss. How is this treated? Making changes to your diet. Taking medicine. Having surgery. Treatment will depend on how bad your symptoms are. Follow these instructions at home: Eating and drinking  Follow a  diet as told by your doctor. You may need to avoid foods and drinks such as: Coffee and tea, with or without caffeine. Drinks that contain alcohol. Energy drinks and sports drinks. Bubbly (carbonated) drinks or sodas. Chocolate and cocoa. Peppermint and mint flavorings. Garlic and onions. Horseradish. Spicy and acidic foods. These include peppers, chili powder, curry powder, vinegar, hot sauces, and BBQ sauce. Citrus fruit juices and citrus fruits, such as oranges, lemons, and limes. Tomato-based foods. These include red sauce, chili, salsa, and pizza with red sauce. Fried and fatty foods. These include donuts, french fries, potato chips, and high-fat dressings. High-fat meats. These include hot dogs, rib eye steak, sausage, ham, and bacon. High-fat dairy items, such as whole milk, butter, and cream cheese. Eat small meals often. Avoid eating large meals. Avoid drinking large amounts of liquid with your meals. Avoid eating meals during the 2-3 hours before bedtime. Avoid lying down right after you eat. Do not exercise right after you eat. Lifestyle  Do not smoke or use any products that contain nicotine or tobacco. If you need help quitting, ask your doctor. Try to lower your stress. If you need help doing this, ask your doctor. If you are overweight, lose an amount of weight that is healthy for you. Ask your doctor about a safe weight loss goal. General instructions Pay attention to any changes in your symptoms. Take over-the-counter and prescription medicines only as told by your doctor. Do not take aspirin, ibuprofen, or other NSAIDs unless your doctor says it is okay. Wear loose clothes. Do not wear anything tight around your waist. Raise (elevate) the head of your bed about   6 inches (15 cm). You may need to use a wedge to do this. Avoid bending over if this makes your symptoms worse. Keep all follow-up visits. Contact a doctor if: You have new symptoms. You lose weight and you  do not know why. You have trouble swallowing or it hurts to swallow. You have wheezing or a cough that keeps happening. You have a hoarse voice. Your symptoms do not get better with treatment. Get help right away if: You have sudden pain in your arms, neck, jaw, teeth, or back. You suddenly feel sweaty, dizzy, or light-headed. You have chest pain or shortness of breath. You vomit and the vomit is green, yellow, or black, or it looks like blood or coffee grounds. You faint. Your poop (stool) is red, bloody, or black. You cannot swallow, drink, or eat. These symptoms may represent a serious problem that is an emergency. Do not wait to see if the symptoms will go away. Get medical help right away. Call your local emergency services (911 in the [place] Do not drive yourself to the hospital. Summary If a person has gastroesophageal reflux disease (GERD), food and stomach acid move back up into the esophagus and cause symptoms or problems such as damage to the esophagus. Treatment will depend on how bad your symptoms are. Follow a diet as told by your doctor. Take all medicines only as told by your doctor. This information is not intended to replace advice given to you by your health care provider. Make sure you discuss any questions you have with your health care provider. Document Revised: 07/17/2019 Document Reviewed: 07/17/2019 Elsevier Patient Education  2024 Elsevier Inc.  

## 2023-01-04 ENCOUNTER — Encounter
Admission: RE | Disposition: A | Discharge status: Home / Self Care | Payer: Self-pay | Source: Ambulatory Visit | Attending: Gastroenterology

## 2023-01-04 ENCOUNTER — Ambulatory Visit: Payer: BC Managed Care – PPO | Admitting: Certified Registered"

## 2023-01-04 ENCOUNTER — Ambulatory Visit
Admission: RE | Admit: 2023-01-04 | Discharge: 2023-01-04 | Disposition: A | Discharge status: Home / Self Care | Payer: BC Managed Care – PPO | Source: Ambulatory Visit | Attending: Gastroenterology | Admitting: Gastroenterology

## 2023-01-04 ENCOUNTER — Encounter: Payer: Self-pay | Admitting: Gastroenterology

## 2023-01-04 DIAGNOSIS — K449 Diaphragmatic hernia without obstruction or gangrene: Secondary | ICD-10-CM | POA: Insufficient documentation

## 2023-01-04 DIAGNOSIS — Z1211 Encounter for screening for malignant neoplasm of colon: Secondary | ICD-10-CM | POA: Diagnosis not present

## 2023-01-04 DIAGNOSIS — F17210 Nicotine dependence, cigarettes, uncomplicated: Secondary | ICD-10-CM | POA: Diagnosis not present

## 2023-01-04 DIAGNOSIS — D126 Benign neoplasm of colon, unspecified: Secondary | ICD-10-CM

## 2023-01-04 DIAGNOSIS — E119 Type 2 diabetes mellitus without complications: Secondary | ICD-10-CM | POA: Insufficient documentation

## 2023-01-04 DIAGNOSIS — D122 Benign neoplasm of ascending colon: Secondary | ICD-10-CM | POA: Insufficient documentation

## 2023-01-04 DIAGNOSIS — Z794 Long term (current) use of insulin: Secondary | ICD-10-CM | POA: Insufficient documentation

## 2023-01-04 DIAGNOSIS — D124 Benign neoplasm of descending colon: Secondary | ICD-10-CM | POA: Insufficient documentation

## 2023-01-04 DIAGNOSIS — Z7984 Long term (current) use of oral hypoglycemic drugs: Secondary | ICD-10-CM | POA: Insufficient documentation

## 2023-01-04 DIAGNOSIS — K219 Gastro-esophageal reflux disease without esophagitis: Secondary | ICD-10-CM | POA: Insufficient documentation

## 2023-01-04 DIAGNOSIS — D128 Benign neoplasm of rectum: Secondary | ICD-10-CM | POA: Diagnosis not present

## 2023-01-04 DIAGNOSIS — D120 Benign neoplasm of cecum: Secondary | ICD-10-CM | POA: Insufficient documentation

## 2023-01-04 DIAGNOSIS — K2100 Gastro-esophageal reflux disease with esophagitis, without bleeding: Secondary | ICD-10-CM

## 2023-01-04 HISTORY — PX: POLYPECTOMY: SHX5525

## 2023-01-04 HISTORY — PX: HEMOSTASIS CLIP PLACEMENT: SHX6857

## 2023-01-04 HISTORY — PX: ESOPHAGOGASTRODUODENOSCOPY (EGD) WITH PROPOFOL: SHX5813

## 2023-01-04 HISTORY — PX: COLONOSCOPY WITH PROPOFOL: SHX5780

## 2023-01-04 LAB — GLUCOSE, CAPILLARY: Glucose-Capillary: 155 mg/dL — ABNORMAL HIGH (ref 70–99)

## 2023-01-04 SURGERY — COLONOSCOPY WITH PROPOFOL
Anesthesia: General

## 2023-01-04 MED ORDER — LIDOCAINE HCL (PF) 2 % IJ SOLN
INTRAMUSCULAR | Status: AC
  Filled 2023-01-04: qty 5

## 2023-01-04 MED ORDER — PROPOFOL 10 MG/ML IV BOLUS
INTRAVENOUS | Status: DC | PRN
  Administered 2023-01-04: 120 ug/kg/min via INTRAVENOUS
  Administered 2023-01-04: 120 mg via INTRAVENOUS

## 2023-01-04 MED ORDER — PROPOFOL 10 MG/ML IV BOLUS
INTRAVENOUS | Status: AC
  Filled 2023-01-04: qty 40

## 2023-01-04 MED ORDER — SODIUM CHLORIDE 0.9 % IV SOLN: INTRAVENOUS | Status: DC

## 2023-01-04 MED ORDER — LIDOCAINE HCL (CARDIAC) PF 100 MG/5ML IV SOSY
PREFILLED_SYRINGE | INTRAVENOUS | Status: DC | PRN
  Administered 2023-01-04: 100 mg via INTRAVENOUS

## 2023-01-04 NOTE — Anesthesia Preprocedure Evaluation (Signed)
Anesthesia Evaluation  Patient identified by MRN, date of birth, ID band Patient awake    Reviewed: Allergy & Precautions, NPO status , Patient's Chart, lab work & pertinent test results  History of Anesthesia Complications Negative for: history of anesthetic complications  Airway Mallampati: II  TM Distance: >3 FB Neck ROM: Full    Dental no notable dental hx. (+) Teeth Intact   Pulmonary neg sleep apnea, neg COPD, Current SmokerPatient did not abstain from smoking.   Pulmonary exam normal breath sounds clear to auscultation       Cardiovascular Exercise Tolerance: Good METS(-) hypertension(-) CAD and (-) Past MI negative cardio ROS (-) dysrhythmias  Rhythm:Regular Rate:Normal - Systolic murmurs    Neuro/Psych  PSYCHIATRIC DISORDERS  Depression    negative neurological ROS     GI/Hepatic ,neg GERD  ,,(+)     (-) substance abuse    Endo/Other  diabetes, Well Controlled, Type 2, Insulin Dependent    Renal/GU negative Renal ROS     Musculoskeletal   Abdominal  (+) + obese  Peds  Hematology   Anesthesia Other Findings Past Medical History: No date: Depression No date: Diabetes mellitus without complication (HCC)  Reproductive/Obstetrics                             Anesthesia Physical Anesthesia Plan  ASA: 3  Anesthesia Plan: General   Post-op Pain Management: Minimal or no pain anticipated   Induction: Intravenous  PONV Risk Score and Plan: 2 and Propofol infusion, TIVA and Ondansetron  Airway Management Planned: Nasal Cannula  Additional Equipment: None  Intra-op Plan:   Post-operative Plan:   Informed Consent: I have reviewed the patients History and Physical, chart, labs and discussed the procedure including the risks, benefits and alternatives for the proposed anesthesia with the patient or authorized representative who has indicated his/her understanding and acceptance.      Dental advisory given  Plan Discussed with: CRNA and Surgeon  Anesthesia Plan Comments: (Discussed risks of anesthesia with patient, including possibility of difficulty with spontaneous ventilation under anesthesia necessitating airway intervention, PONV, and rare risks such as cardiac or respiratory or neurological events, and allergic reactions. Discussed the role of CRNA in patient's perioperative care. Patient understands. Patient counseled on benefits of smoking cessation, and increased perioperative risks associated with continued smoking. )       Anesthesia Quick Evaluation

## 2023-01-04 NOTE — Op Note (Signed)
[facility]astro[facility] Patient Name: Robyn Sanchez Procedure Date: 01/04/2023 9:55 AM MRN: [id number] Account #: [id number] Date of Birth: 1965-02-03 Admit Type: Outpatient Age: 57 Room: [facility] ENDO ROOM 1 Gender: Female Note Status: Finalized Instrument Name: Prentice Docker [id number] Procedure:             Colonoscopy Indications:           Screening for colorectal malignant neoplasm Providers:             Wyline Mood MD, MD Referring MD:          Noralyn Pick (Referring MD) Medicines:             Monitored Anesthesia Care Complications:         No immediate complications. Procedure:             Pre-Anesthesia Assessment:                        - Prior to the procedure, a History and Physical was                         performed, and patient medications, allergies and                         sensitivities were reviewed. The patient's tolerance                         of previous anesthesia was reviewed.                        - The risks and benefits of the procedure and the                         sedation options and risks were discussed with the                         patient. All questions were answered and informed                         consent was obtained.                        - ASA Grade Assessment: II - A patient with mild                         systemic disease.                        After obtaining informed consent, the colonoscope was                         passed under direct vision. Throughout the procedure,                         the patient's blood pressure, pulse, and oxygen                         saturations were monitored continuously. The                         Colonoscope was introduced  through the anus and                         advanced to the the cecum, identified by the                         appendiceal orifice. The colonoscopy was performed                         with ease. The patient tolerated the procedure well.                          The quality of the bowel preparation was good. The                         ileocecal valve, appendiceal orifice, and rectum were                         photographed. Findings:      The perianal and digital rectal examinations were normal.      A 5 mm polyp was found in the cecum. The polyp was sessile. The polyp       was removed with a cold snare. Resection and retrieval were complete.      Seven sessile polyps were found in the ascending colon. The polyps were       6 to 10 mm in size. These polyps were removed with a cold snare.       Resection and retrieval were complete. To prevent bleeding after the       polypectomy, three hemostatic clips were successfully placed. Clip       manufacturer: [employer]. There was no bleeding at the end of the       procedure.      An 8 mm polyp was found in the descending colon. The polyp was sessile.       The polyp was removed with a cold snare. Resection and retrieval were       complete. To prevent bleeding after the polypectomy, two hemostatic       clips were successfully placed. Clip manufacturer: [employer].       There was no bleeding at the end of the procedure.      A 5 mm polyp was found in the rectum. The polyp was sessile. The polyp       was removed with a cold snare. Resection and retrieval were complete.      The exam was otherwise without abnormality on direct and retroflexion       views. Impression:            - One 5 mm polyp in the cecum, removed with a cold                         snare. Resected and retrieved.                        - Seven 6 to 10 mm polyps in the ascending colon,                         removed with a cold snare. Resected and retrieved.  Clips were placed. Clip manufacturer: [employer].                        - One 8 mm polyp in the descending colon, removed with                         a cold snare. Resected and  retrieved. Clips were                         placed. Clip manufacturer: [employer].                        - One 5 mm polyp in the rectum, removed with a cold                         snare. Resected and retrieved.                        - The examination was otherwise normal on direct and                         retroflexion views. Recommendation:        - Discharge patient to home (with escort).                        - Resume previous diet.                        - Continue present medications.                        - Await pathology results.                        - Repeat colonoscopy in 1 year for surveillance based                         on pathology results. Procedure Code(s):     --- Professional ---                        [phone], Colonoscopy, flexible; with removal of                         tumor(s), polyp(s), or other lesion(s) by snare                         technique Diagnosis Code(s):     --- Professional ---                        Z12.11, Encounter for screening for malignant neoplasm                         of colon                        D12.0, Benign neoplasm of cecum  D12.4, Benign neoplasm of descending colon                        D12.8, Benign neoplasm of rectum                        D12.2, Benign neoplasm of ascending colon CPT copyright 2022 American Medical Association. All rights reserved. The codes documented in this report are preliminary and upon coder review may  be revised to meet current compliance requirements. Wyline Mood, MD Wyline Mood MD, MD 01/04/2023 10:44:25 AM This report has been signed electronically. Number of Addenda: 0 Note Initiated On: 01/04/2023 9:55 AM Scope Withdrawal Time: 0 hours 22 minutes 5 seconds  Total Procedure Duration: 0 hours 25 minutes 6 seconds  Estimated Blood Loss:  Estimated blood loss: none.      [facility]

## 2023-01-04 NOTE — H&P (Signed)
Wyline Mood, MD [address], [place], [place], [address] [address], [place], [place], [address] Phone: [phone]  Fax: [phone]  Primary Care Physician:  Noralyn Pick, NP   Pre-Procedure History & Physical: HPI:  Robyn Sanchez is a 57 y.o. female is here for an endoscopy and colonoscopy    Past Medical History:  Diagnosis Date   Depression    Diabetes mellitus without complication [facility])     Past Surgical History:  Procedure Laterality Date   ABDOMINAL HYSTERECTOMY     KNEE SURGERY Left     Prior to Admission medications   Medication Sig Start Date End Date Taking? Authorizing Provider  cyclobenzaprine (FLEXERIL) 5 MG tablet Take 5-10 mg by mouth at bedtime.    [provider]  EPINEPHrine (EPIPEN 2-PAK) 0.3 mg/0.3 mL IJ SOAJ injection Inject 0.3 mg into the muscle as needed for anaphylaxis. 08/17/20   Triplett, Cari B, FNP  gabapentin (NEURONTIN) 100 MG capsule Take 100 mg by mouth 3 (three) times daily.    [provider]  HYDROcodone-acetaminophen (NORCO/VICODIN) 5-325 MG tablet Take 1-2 tablets by mouth every 6 (six) hours as needed for moderate pain or severe pain. 09/25/21   Domenick Gong, MD  insulin glargine (LANTUS) 100 UNIT/ML injection Inject 20 Units into the skin at bedtime.    [provider]  insulin NPH Human (HUMULIN N,NOVOLIN N) 100 UNIT/ML injection Inject 8 Units into the skin.    [provider]  metFORMIN (GLUCOPHAGE) 500 MG tablet Take by mouth 2 (two) times daily with a meal.    [provider]  naproxen (NAPROSYN) 500 MG tablet Take 1 tablet (500 mg total) by mouth 2 (two) times daily. 09/25/21   Domenick Gong, MD  omeprazole (PRILOSEC) 40 MG capsule Take 1 capsule (40 mg total) by mouth daily. 12/28/22   Wyline Mood, MD  SEMGLEE, YFGN, 100 UNIT/ML Pen Inject into the skin. 09/18/21   [provider]  sertraline (ZOLOFT) 50 MG tablet Take 50 mg by  mouth daily.    [provider]    Allergies as of 12/28/2022 - Review Complete 12/28/2022  Allergen Reaction Noted   Bee venom Anaphylaxis 09/17/2020   Codeine Nausea And Vomiting 09/13/2014    No family history on file.  Social History   Socioeconomic History   Marital status: Married    Spouse name: Not on file   Number of children: Not on file   Years of education: Not on file   Highest education level: Not on file  Occupational History   Not on file  Tobacco Use   Smoking status: Every Day    Current packs/day: 1.00    Types: Cigarettes   Smokeless tobacco: Never  Vaping Use   Vaping status: Never Used  Substance and Sexual Activity   Alcohol use: Yes   Drug use: Never   Sexual activity: Not on file  Other Topics Concern   Not on file  Social History Narrative   Not on file   Social Drivers of Health   Financial Resource Strain: Low Risk  (04/27/2022)   Received from [facility]   Overall Financial Resource Strain (CARDIA)    Difficulty of Paying Living Expenses: Not hard at all  Food Insecurity: No Food Insecurity (04/27/2022)   Received from [facility]   Hunger Vital Sign    Worried About Running Out of Food in the Last Year: Never true  Ran Out of Food in the Last Year: Never true  Transportation Needs: No Transportation Needs (04/27/2022)   Received from [facility] - Transportation    Lack of Transportation (Medical): No    Lack of Transportation (Non-Medical): No  Physical Activity: Not on file  Stress: Not on file  Social Connections: Not on file  Intimate Partner Violence: Not on file    Review of Systems: See HPI, otherwise negative ROS  Physical Exam: There were no vitals taken for this visit. General:   Alert,  pleasant and cooperative in NAD Head:  Normocephalic and atraumatic. Neck:  Supple; no masses or thyromegaly. Lungs:  Clear throughout to auscultation, normal respiratory effort.    Heart:  +S1,  +S2, Regular rate and rhythm, No edema. Abdomen:  Soft, nontender and nondistended. Normal bowel sounds, without guarding, and without rebound.   Neurologic:  Alert and  oriented x4;  grossly normal neurologically.  Impression/Plan: Robyn Sanchez is here for an endoscopy and colonoscopy  to be performed for  evaluation of gerd and colon cancer screening     Risks, benefits, limitations, and alternatives regarding endoscopy have been reviewed with the patient.  Questions have been answered.  All parties agreeable.   Wyline Mood, MD  01/04/2023, 9:28 AM

## 2023-01-04 NOTE — Anesthesia Postprocedure Evaluation (Signed)
Anesthesia Post Note  Patient: Dina Condrey  Procedure(s) Performed: COLONOSCOPY WITH PROPOFOL ESOPHAGOGASTRODUODENOSCOPY (EGD) WITH PROPOFOL POLYPECTOMY HEMOSTASIS CLIP PLACEMENT  Patient location during evaluation: Endoscopy Anesthesia Type: General Level of consciousness: awake and alert Pain management: pain level controlled Vital Signs Assessment: post-procedure vital signs reviewed and stable Respiratory status: spontaneous breathing, nonlabored ventilation, respiratory function stable and patient connected to nasal cannula oxygen Cardiovascular status: blood pressure returned to baseline and stable Postop Assessment: no apparent nausea or vomiting Anesthetic complications: no   No notable events documented.   Last Vitals:  Vitals:   01/04/23 1059 01/04/23 1104  BP: (!) 154/83 (!) 152/86  Pulse: 77 75  Resp: (!) 22 17  Temp:  (!) 36.1 C  SpO2: 100% 100%    Last Pain:  Vitals:   01/04/23 1104  TempSrc:   PainSc: 0-No pain                 Corinda Gubler

## 2023-01-04 NOTE — Transfer of Care (Signed)
Immediate Anesthesia Transfer of Care Note  Patient: Kessiah Monteleone  Procedure(s) Performed: COLONOSCOPY WITH PROPOFOL ESOPHAGOGASTRODUODENOSCOPY (EGD) WITH PROPOFOL POLYPECTOMY HEMOSTASIS CLIP PLACEMENT  Patient Location: Endoscopy Unit  Anesthesia Type:General  Level of Consciousness: awake, alert , and oriented  Airway & Oxygen Therapy: Patient Spontanous Breathing  Post-op Assessment: Report given to RN and Post -op Vital signs reviewed and stable  Post vital signs: Reviewed and stable  Last Vitals:  Vitals Value Taken Time  BP 131/67 01/04/23 1050  Temp 36.1 C 01/04/23 1050  Pulse 76 01/04/23 1052  Resp 23 01/04/23 1052  SpO2 100 % 01/04/23 1052  Vitals shown include unfiled device data.  Last Pain:  Vitals:   01/04/23 1050  TempSrc:   PainSc: Asleep         Complications: No notable events documented.

## 2023-01-04 NOTE — Op Note (Signed)
[facility] Patient Name: Robyn Sanchez Procedure Date: 01/04/2023 9:56 AM MRN: [id number] Account #: [id number] Date of Birth: 1965/05/05 Admit Type: Outpatient Age: 57 Room: [facility] ENDO ROOM 1 Gender: Female Note Status: Finalized Instrument Name: Laurette Schimke [id number] Procedure:             Upper GI endoscopy Indications:           Follow-up of esophageal reflux Providers:             Wyline Mood MD, MD Referring MD:          Noralyn Pick (Referring MD) Medicines:             Monitored Anesthesia Care Complications:         No immediate complications. Procedure:             Pre-Anesthesia Assessment:                        - Prior to the procedure, a History and Physical was                         performed, and patient medications, allergies and                         sensitivities were reviewed. The patient's tolerance                         of previous anesthesia was reviewed.                        - The risks and benefits of the procedure and the                         sedation options and risks were discussed with the                         patient. All questions were answered and informed                         consent was obtained.                        - ASA Grade Assessment: II - A patient with mild                         systemic disease.                        After obtaining informed consent, the endoscope was                         passed under direct vision. Throughout the procedure,                         the patient's blood pressure, pulse, and oxygen                         saturations were monitored continuously. The Endoscope                         was  introduced through the mouth, and advanced to the                         third part of duodenum. The upper GI endoscopy was                         accomplished with ease. The patient tolerated the                         procedure well. Findings:      The  examined duodenum was normal.      The esophagus was normal.      A medium-sized hiatal hernia was present.      The cardia and gastric fundus were normal on retroflexion. Impression:            - Normal examined duodenum.                        - Normal esophagus.                        - Medium-sized hiatal hernia.                        - No specimens collected. Recommendation:        - Perform a colonoscopy today. Procedure Code(s):     --- Professional ---                        [phone], Esophagogastroduodenoscopy, flexible,                         transoral; diagnostic, including collection of                         specimen(s) by brushing or washing, when performed                         (separate procedure) Diagnosis Code(s):     --- Professional ---                        K44.9, Diaphragmatic hernia without obstruction or                         gangrene                        K21.9, Gastro-esophageal reflux disease without                         esophagitis CPT copyright 2022 American [employer] Association. All rights reserved. The codes documented in this report are preliminary and upon coder review may  be revised to meet current compliance requirements. Wyline Mood, MD Wyline Mood MD, MD 01/04/2023 10:14:23 AM This report has been signed electronically. Number of Addenda: 0 Note Initiated On: 01/04/2023 9:56 AM Estimated Blood Loss:  Estimated blood loss: none.      [facility]

## 2023-01-05 ENCOUNTER — Encounter: Payer: Self-pay | Admitting: Gastroenterology

## 2023-01-07 LAB — SURGICAL PATHOLOGY

## 2023-01-08 ENCOUNTER — Encounter: Payer: Self-pay | Admitting: Gastroenterology

## 2023-01-08 DIAGNOSIS — Z860100 Personal history of colon polyps, unspecified: Secondary | ICD-10-CM

## 2023-02-03 ENCOUNTER — Other Ambulatory Visit: Payer: Self-pay | Admitting: Gastroenterology

## 2023-02-19 ENCOUNTER — Other Ambulatory Visit: Payer: Self-pay | Admitting: Gastroenterology

## 2023-02-22 ENCOUNTER — Telehealth: Payer: Self-pay | Admitting: Physician Assistant

## 2023-02-22 NOTE — Telephone Encounter (Signed)
The patient called in to reschedule her appointment because she will be out of town. The patient said that she will call back to reschedule. The patient wanted to know if she has a balance, I inform her nothing shows in the system, and they will send her a bill if so in the mail.

## 2023-03-01 ENCOUNTER — Ambulatory Visit: Payer: BC Managed Care – PPO | Admitting: Physician Assistant

## 2023-05-23 ENCOUNTER — Other Ambulatory Visit: Payer: Self-pay | Admitting: Physician Assistant

## 2023-10-06 ENCOUNTER — Telehealth: Payer: Self-pay

## 2023-10-06 NOTE — Telephone Encounter (Signed)
 error

## 2023-11-29 ENCOUNTER — Telehealth: Payer: Self-pay

## 2023-11-29 NOTE — Telephone Encounter (Signed)
 Pt would like to schedule procedure with [facility].

## 2023-11-29 NOTE — Telephone Encounter (Signed)
 Due to patient's insurance she has declined scheduling her colonoscopy.  She needed to have it this year as opposed to next year.  Informed her that she can see if Dr. Therisa can see her at [facility] for colonoscopy sooner than next year for her colonoscopy.Robyn Sanchez, CMA

## 2023-12-08 ENCOUNTER — Ambulatory Visit
Admission: EM | Admit: 2023-12-08 | Discharge: 2023-12-08 | Disposition: A | Discharge status: Home / Self Care | Attending: Emergency Medicine | Admitting: Emergency Medicine

## 2023-12-08 DIAGNOSIS — J069 Acute upper respiratory infection, unspecified: Secondary | ICD-10-CM | POA: Diagnosis present

## 2023-12-08 LAB — POCT RAPID STREP A (OFFICE): Rapid Strep A Screen: NEGATIVE

## 2023-12-08 MED ORDER — IPRATROPIUM BROMIDE 0.06 % NA SOLN: 2.0000 | Freq: Four times a day (QID) | NASAL | 12 refills | Status: AC

## 2023-12-08 MED ORDER — BENZONATATE 100 MG PO CAPS: 200.0000 mg | ORAL_CAPSULE | Freq: Three times a day (TID) | ORAL | 0 refills | Status: AC

## 2023-12-08 MED ORDER — PROMETHAZINE-DM 6.25-15 MG/5ML PO SYRP: 5.0000 mL | ORAL_SOLUTION | Freq: Four times a day (QID) | ORAL | 0 refills | Status: AC | PRN

## 2023-12-08 MED ORDER — ALBUTEROL SULFATE HFA 108 (90 BASE) MCG/ACT IN AERS: 2.0000 | INHALATION_SPRAY | RESPIRATORY_TRACT | 0 refills | Status: AC | PRN

## 2023-12-08 MED ORDER — AMOXICILLIN-POT CLAVULANATE 875-125 MG PO TABS: 1.0000 | ORAL_TABLET | Freq: Two times a day (BID) | ORAL | 0 refills | Status: AC

## 2023-12-08 MED ORDER — AEROCHAMBER MV MISC
2 refills | Status: AC
Start: 2023-12-08 — End: ?

## 2023-12-08 NOTE — Discharge Instructions (Addendum)
 Your strep test today was negative.  We will send her swab for culture.  Take the Augmentin 875 mg twice daily with food for 10 days for treatment of your upper respiratory tract infection.  This will also cover for potential strep should the culture come back positive.  Use the albuterol inhaler, with the spacer, and take 1 to 2 puffs every 4-6 hours as needed for any shortness breath or wheezing.  Use over-the-counter Tylenol  and/or ibuprofen  according to the package instructions as needed for any fever or pain.  Use the Atrovent nasal spray, 2 squirts in each nostril every 6 hours, as needed for runny nose and postnasal drip.  Use the Tessalon Perles every 8 hours during the day.  Take them with a small sip of water.  They may give you some numbness to the base of your tongue or a metallic taste in your mouth, this is normal.  Use the Promethazine DM cough syrup at bedtime for cough and congestion.  It will make you drowsy so do not take it during the day.  Return for reevaluation or see your primary care provider for any new or worsening symptoms.

## 2023-12-08 NOTE — ED Provider Notes (Signed)
 [facility]    CSN: [id number] Arrival date & time: 12/08/23  9076      History   Chief Complaint Chief Complaint  Patient presents with   Cough   Sore Throat   Otalgia    HPI Allysa Governale is a 58 y.o. female.   HPI  58 year old female with past medical history significant for diabetes and depression presents for evaluation 2 weeks with respiratory symptoms that include bilateral ear pain, nasal congestion, sore throat, nonproductive cough, chest congestion, shortness of breath, and wheezing.  No fever or nasal discharge.  She reports that she contracted her symptoms from her granddaughter 2 weeks ago.  Past Medical History:  Diagnosis Date   Depression    Diabetes mellitus without complication [facility])     Patient Active Problem List   Diagnosis Date Noted   Colon cancer screening 01/04/2023   Adenomatous polyp of colon 01/04/2023   Gastroesophageal reflux disease with esophagitis without hemorrhage 01/04/2023    Past Surgical History:  Procedure Laterality Date   ABDOMINAL HYSTERECTOMY     COLONOSCOPY WITH PROPOFOL      COLONOSCOPY WITH PROPOFOL  N/A 01/04/2023   Procedure: COLONOSCOPY WITH PROPOFOL ;  Surgeon: Therisa Bi, MD;  Location: [facility] ENDOSCOPY;  Service: Gastroenterology;  Laterality: N/A;   ESOPHAGOGASTRODUODENOSCOPY (EGD) WITH PROPOFOL  N/A 01/04/2023   Procedure: ESOPHAGOGASTRODUODENOSCOPY (EGD) WITH PROPOFOL ;  Surgeon: Therisa Bi, MD;  Location: [facility] ENDOSCOPY;  Service: Gastroenterology;  Laterality: N/A;   HEMOSTASIS CLIP PLACEMENT  01/04/2023   Procedure: HEMOSTASIS CLIP PLACEMENT;  Surgeon: Therisa Bi, MD;  Location: [facility] ENDOSCOPY;  Service: Gastroenterology;;   KNEE SURGERY Left    POLYPECTOMY  01/04/2023   Procedure: POLYPECTOMY;  Surgeon: Therisa Bi, MD;  Location: [facility] ENDOSCOPY;  Service: Gastroenterology;;    OB History   No obstetric history on file.      Home Medications    Prior to Admission medications    Medication Sig Start Date End Date Taking? Authorizing Provider  albuterol (VENTOLIN HFA) 108 (90 Base) MCG/ACT inhaler Inhale 2 puffs into the lungs every 4 (four) hours as needed. 12/08/23  Yes Bernardino Ditch, NP  amoxicillin-clavulanate (AUGMENTIN) 875-125 MG tablet Take 1 tablet by mouth every 12 (twelve) hours for 10 days. 12/08/23 12/18/23 Yes Bernardino Ditch, NP  benzonatate (TESSALON) 100 MG capsule Take 2 capsules (200 mg total) by mouth every 8 (eight) hours. 12/08/23  Yes Bernardino Ditch, NP  gabapentin (NEURONTIN) 100 MG capsule Take 100 mg by mouth 3 (three) times daily.   Yes [provider]  insulin glargine (LANTUS) 100 UNIT/ML injection Inject 20 Units into the skin at bedtime.   Yes [provider]  insulin NPH Human (HUMULIN N,NOVOLIN N) 100 UNIT/ML injection Inject 8 Units into the skin.   Yes [provider]  ipratropium (ATROVENT) 0.06 % nasal spray Place 2 sprays into both nostrils 4 (four) times daily. 12/08/23  Yes Bernardino Ditch, NP  lisinopril (ZESTRIL) 5 MG tablet Take 5 mg by mouth daily.   Yes [provider]  metFORMIN (GLUCOPHAGE) 500 MG tablet Take by mouth 2 (two) times daily with a meal.   Yes [provider]  promethazine-dextromethorphan (PROMETHAZINE-DM) 6.25-15 MG/5ML syrup Take 5 mLs by mouth 4 (four) times daily as needed. 12/08/23  Yes Bernardino Ditch, NP  sertraline (ZOLOFT) 50 MG tablet Take 50 mg by mouth daily.   Yes [provider]  Spacer/Aero-Holding Chambers (AEROCHAMBER MV) inhaler Use as instructed 12/08/23  Yes Bernardino Ditch, NP  cyclobenzaprine (FLEXERIL)  5 MG tablet Take 5-10 mg by mouth at bedtime.    [provider]  EPINEPHrine  (EPIPEN  2-PAK) 0.3 mg/0.3 mL IJ SOAJ injection Inject 0.3 mg into the muscle as needed for anaphylaxis. 08/17/20   Triplett, Cari B, FNP  HYDROcodone -acetaminophen  (NORCO/VICODIN) 5-325 MG tablet Take 1-2 tablets by mouth every 6 (six) hours as needed for moderate pain or  severe pain. 09/25/21   Van Knee, MD  naproxen  (NAPROSYN ) 500 MG tablet Take 1 tablet (500 mg total) by mouth 2 (two) times daily. 09/25/21   Van Knee, MD  omeprazole  (PRILOSEC) 40 MG capsule TAKE 1 CAPSULE (40 MG TOTAL) BY MOUTH DAILY. 05/26/23   Vanga, Corinn Skiff, MD  SEMGLEE, YFGN, 100 UNIT/ML Pen Inject into the skin. 09/18/21   [provider]    Family History History reviewed. No pertinent family history.  Social History Social History   Tobacco Use   Smoking status: Every Day    Current packs/day: 1.00    Types: Cigarettes   Smokeless tobacco: Never  Vaping Use   Vaping status: Never Used  Substance Use Topics   Alcohol use: Not Currently   Drug use: Never     Allergies   Bee venom and Codeine   Review of Systems Review of Systems  Constitutional:  Negative for fever.  HENT:  Positive for congestion and sore throat. Negative for ear pain and rhinorrhea.   Respiratory:  Positive for cough, shortness of breath and wheezing.      Physical Exam Triage Vital Signs ED Triage Vitals  Encounter Vitals Group     BP 12/08/23 1044 128/86     Girls Systolic BP Percentile --      Girls Diastolic BP Percentile --      Boys Systolic BP Percentile --      Boys Diastolic BP Percentile --      Pulse Rate 12/08/23 1044 (!) 106     Resp 12/08/23 1044 20     Temp 12/08/23 1044 98.8 F (37.1 C)     Temp Source 12/08/23 1044 Oral     SpO2 12/08/23 1044 94 %     Weight 12/08/23 1043 204 lb (92.5 kg)     Height --      Head Circumference --      Peak Flow --      Pain Score 12/08/23 1043 9     Pain Loc --      Pain Education --      Exclude from Growth Chart --    No data found.  Updated Vital Signs BP 128/86 (BP Location: Right Arm)   Pulse (!) 106   Temp 98.8 F (37.1 C) (Oral)   Resp 20   Wt 204 lb (92.5 kg)   SpO2 94%   BMI 32.93 kg/m   Visual Acuity Right Eye Distance:   Left Eye Distance:   Bilateral Distance:    Right Eye  Near:   Left Eye Near:    Bilateral Near:     Physical Exam Vitals and nursing note reviewed.  Constitutional:      Appearance: Normal appearance. She is not ill-appearing.  HENT:     Head: Normocephalic and atraumatic.     Right Ear: Tympanic membrane, ear canal and external ear normal. There is no impacted cerumen.     Left Ear: Tympanic membrane, ear canal and external ear normal. There is no impacted cerumen.     Nose: Congestion and rhinorrhea present.  Comments: Nasal mucosa is edematous and erythematous with clear discharge in both nares.    Mouth/Throat:     Mouth: Mucous membranes are moist.     Pharynx: Oropharynx is clear. Posterior oropharyngeal erythema present. No oropharyngeal exudate.     Comments: Mild erythema to the posterior oropharynx with clear postnasal drip. Cardiovascular:     Rate and Rhythm: Normal rate and regular rhythm.     Pulses: Normal pulses.     Heart sounds: Normal heart sounds. No murmur heard.    No friction rub. No gallop.  Pulmonary:     Effort: Pulmonary effort is normal.     Breath sounds: Normal breath sounds. No wheezing, rhonchi or rales.  Musculoskeletal:     Cervical back: Normal range of motion and neck supple. No tenderness.  Lymphadenopathy:     Cervical: No cervical adenopathy.  Skin:    General: Skin is warm and dry.     Capillary Refill: Capillary refill takes less than 2 seconds.     Findings: No rash.  Neurological:     General: No focal deficit present.     Mental Status: She is alert and oriented to person, place, and time.      UC Treatments / Results  Labs (all labs ordered are listed, but only abnormal results are displayed) Labs Reviewed  POCT RAPID STREP A (OFFICE) - Normal  CULTURE, GROUP A STREP [facility])    EKG   Radiology No results found.  Procedures Procedures (including critical care time)  Medications Ordered in UC Medications - No data to display  Initial Impression / Assessment and  Plan / UC Course  I have reviewed the triage vital signs and the nursing notes.  Pertinent labs & imaging results that were available during my care of the patient were reviewed by me and considered in my medical decision making (see chart for details).   The patient is a nontoxic-appearing 58 year old female presenting for evaluation of 2 weeks with respiratory symptoms as outlined HPI above.  Her most prominent symptom is a sore throat.  I will order a rapid strep.  Given the fact that she has had symptoms for 2 weeks I will not test her for COVID or influenza.  Her lungs are clear auscultation all fields so I do not feel a chest x-ray is needed at this time.  Rapid strep is negative.  I will send swab for culture.  I will discharge her home with diagnosis of URI with cough and congestion.  Given that she has had symptoms for 2 weeks I do feel a trial of antibiotics is warranted.  I will start her on Augmentin 875 mg twice daily with food for 10 days.  This will cover for any potential strep that is present.  I will prescribe Atrovent nasal spray to help with the congestion and Tessalon Perles and Promethazine DM cough syrup for cough and congestion.  Additionally, I will prescribe an albuterol inhaler and a spacer and she can do 1 to 2 puffs every 4-6 hours as needed for shortness breath or wheezing.  Return precautions reviewed.   Final Clinical Impressions(s) / UC Diagnoses   Final diagnoses:  URI with cough and congestion     Discharge Instructions      Your strep test today was negative.  We will send her swab for culture.  Take the Augmentin 875 mg twice daily with food for 10 days for treatment of your upper respiratory tract infection.  This  will also cover for potential strep should the culture come back positive.  Use the albuterol inhaler, with the spacer, and take 1 to 2 puffs every 4-6 hours as needed for any shortness breath or wheezing.  Use over-the-counter Tylenol   and/or ibuprofen  according to the package instructions as needed for any fever or pain.  Use the Atrovent nasal spray, 2 squirts in each nostril every 6 hours, as needed for runny nose and postnasal drip.  Use the Tessalon Perles every 8 hours during the day.  Take them with a small sip of water.  They may give you some numbness to the base of your tongue or a metallic taste in your mouth, this is normal.  Use the Promethazine DM cough syrup at bedtime for cough and congestion.  It will make you drowsy so do not take it during the day.  Return for reevaluation or see your primary care provider for any new or worsening symptoms.      ED Prescriptions     Medication Sig Dispense Auth. Provider   Spacer/Aero-Holding Chambers (AEROCHAMBER MV) inhaler Use as instructed 1 each Bernardino Ditch, NP   albuterol (VENTOLIN HFA) 108 (90 Base) MCG/ACT inhaler Inhale 2 puffs into the lungs every 4 (four) hours as needed. 18 g Bernardino Ditch, NP   amoxicillin-clavulanate (AUGMENTIN) 875-125 MG tablet Take 1 tablet by mouth every 12 (twelve) hours for 10 days. 20 tablet Bernardino Ditch, NP   benzonatate (TESSALON) 100 MG capsule Take 2 capsules (200 mg total) by mouth every 8 (eight) hours. 21 capsule Bernardino Ditch, NP   ipratropium (ATROVENT) 0.06 % nasal spray Place 2 sprays into both nostrils 4 (four) times daily. 15 mL Bernardino Ditch, NP   promethazine-dextromethorphan (PROMETHAZINE-DM) 6.25-15 MG/5ML syrup Take 5 mLs by mouth 4 (four) times daily as needed. 118 mL Bernardino Ditch, NP      PDMP not reviewed this encounter.   Bernardino Ditch, NP 12/08/23 1115

## 2023-12-08 NOTE — ED Triage Notes (Signed)
 Sx x 2 weeks  Sore throat Cough Chest congestion Bilateral ear pain Wheezing

## 2023-12-10 ENCOUNTER — Ambulatory Visit: Payer: Self-pay

## 2023-12-10 LAB — CULTURE, GROUP A STREP (THRC)
